# Patient Record
Sex: Female | Born: 1950 | Race: White | Hispanic: No | Smoking: Former smoker
Health system: Southern US, Community
[De-identification: ages and names within clinical notes are randomized; demographics above are authoritative.]

## PROBLEM LIST (undated history)

## (undated) DIAGNOSIS — R112 Nausea with vomiting, unspecified: Secondary | ICD-10-CM

## (undated) DIAGNOSIS — Z9889 Other specified postprocedural states: Secondary | ICD-10-CM

## (undated) DIAGNOSIS — I10 Essential (primary) hypertension: Secondary | ICD-10-CM

## (undated) DIAGNOSIS — E039 Hypothyroidism, unspecified: Secondary | ICD-10-CM

## (undated) HISTORY — PX: THYROID CYST EXCISION: SHX2511

## (undated) HISTORY — PX: SHOULDER SURGERY: SHX246

## (undated) HISTORY — PX: BREAST BIOPSY: SHX20

## (undated) HISTORY — PX: HEMORROIDECTOMY: SUR656

---

## 1997-08-19 ENCOUNTER — Other Ambulatory Visit: Admission: RE | Admit: 1997-08-19 | Discharge: 1997-08-19 | Payer: Self-pay | Admitting: Obstetrics and Gynecology

## 1998-07-05 ENCOUNTER — Other Ambulatory Visit: Admission: RE | Admit: 1998-07-05 | Discharge: 1998-07-05 | Payer: Self-pay | Admitting: Obstetrics and Gynecology

## 1999-06-28 ENCOUNTER — Other Ambulatory Visit: Admission: RE | Admit: 1999-06-28 | Discharge: 1999-06-28 | Payer: Self-pay | Admitting: Obstetrics and Gynecology

## 1999-08-23 ENCOUNTER — Other Ambulatory Visit: Admission: RE | Admit: 1999-08-23 | Discharge: 1999-08-23 | Payer: Self-pay | Admitting: Obstetrics and Gynecology

## 1999-08-23 ENCOUNTER — Encounter (INDEPENDENT_AMBULATORY_CARE_PROVIDER_SITE_OTHER): Payer: Self-pay

## 1999-09-27 ENCOUNTER — Ambulatory Visit (HOSPITAL_BASED_OUTPATIENT_CLINIC_OR_DEPARTMENT_OTHER): Admission: RE | Admit: 1999-09-27 | Discharge: 1999-09-27 | Payer: Self-pay | Admitting: Orthopedic Surgery

## 2000-07-12 ENCOUNTER — Other Ambulatory Visit: Admission: RE | Admit: 2000-07-12 | Discharge: 2000-07-12 | Payer: Self-pay | Admitting: *Deleted

## 2001-05-22 ENCOUNTER — Emergency Department (HOSPITAL_COMMUNITY): Admission: EM | Admit: 2001-05-22 | Discharge: 2001-05-22 | Payer: Self-pay | Admitting: Emergency Medicine

## 2001-05-22 ENCOUNTER — Encounter: Payer: Self-pay | Admitting: Emergency Medicine

## 2001-07-19 ENCOUNTER — Other Ambulatory Visit: Admission: RE | Admit: 2001-07-19 | Discharge: 2001-07-19 | Payer: Self-pay | Admitting: Obstetrics & Gynecology

## 2002-09-19 ENCOUNTER — Other Ambulatory Visit: Admission: RE | Admit: 2002-09-19 | Discharge: 2002-09-19 | Payer: Self-pay | Admitting: Obstetrics & Gynecology

## 2009-05-31 ENCOUNTER — Encounter: Admission: RE | Admit: 2009-05-31 | Discharge: 2009-05-31 | Payer: Self-pay | Admitting: Neurosurgery

## 2010-07-11 ENCOUNTER — Encounter: Payer: Self-pay | Admitting: Orthopedic Surgery

## 2010-07-13 ENCOUNTER — Emergency Department (HOSPITAL_COMMUNITY)
Admission: EM | Admit: 2010-07-13 | Discharge: 2010-07-13 | Disposition: A | Discharge status: Other Acute Inpatient Hospital | Payer: Self-pay | Source: Home / Self Care | Admitting: Emergency Medicine

## 2010-07-13 ENCOUNTER — Observation Stay (HOSPITAL_COMMUNITY)
Admission: EM | Admit: 2010-07-13 | Discharge: 2010-07-14 | Payer: Self-pay | Source: Home / Self Care | Admitting: Emergency Medicine

## 2010-07-13 LAB — POCT I-STAT, CHEM 8
BUN: 15 mg/dL (ref 6–23)
Calcium, Ion: 1.02 mmol/L — ABNORMAL LOW (ref 1.12–1.32)
Chloride: 107 mEq/L (ref 96–112)
Creatinine, Ser: 0.7 mg/dL (ref 0.4–1.2)
Glucose, Bld: 102 mg/dL — ABNORMAL HIGH (ref 70–99)
HCT: 35 % — ABNORMAL LOW (ref 36.0–46.0)
Hemoglobin: 11.9 g/dL — ABNORMAL LOW (ref 12.0–15.0)
Potassium: 4.4 mEq/L (ref 3.5–5.1)
Sodium: 138 mEq/L (ref 135–145)
TCO2: 24 mmol/L (ref 0–100)

## 2010-07-13 LAB — POCT CARDIAC MARKERS
CKMB, poc: <1 ng/mL — ABNORMAL LOW (ref 1.0–8.0)
Myoglobin, poc: 31.8 ng/mL (ref 12–200)
Troponin i, poc: <0.05 ng/mL (ref 0.00–0.09)

## 2010-07-13 LAB — URINALYSIS, ROUTINE W REFLEX MICROSCOPIC
Bilirubin Urine: NEGATIVE
Hgb urine dipstick: NEGATIVE
Ketones, ur: 15 mg/dL — AB
Nitrite: NEGATIVE
Protein, ur: NEGATIVE mg/dL
Specific Gravity, Urine: 1.013 (ref 1.005–1.030)
Urine Glucose, Fasting: NEGATIVE mg/dL
Urobilinogen, UA: 0.2 mg/dL (ref 0.0–1.0)
pH: 6.5 (ref 5.0–8.0)

## 2010-07-13 LAB — DIFFERENTIAL
Basophils Absolute: 0 10*3/uL (ref 0.0–0.1)
Basophils Relative: 0 % (ref 0–1)
Eosinophils Absolute: 0 10*3/uL (ref 0.0–0.7)
Eosinophils Relative: 1 % (ref 0–5)
Lymphocytes Relative: 38 % (ref 12–46)
Lymphs Abs: 1.5 10*3/uL (ref 0.7–4.0)
Monocytes Absolute: 0.4 10*3/uL (ref 0.1–1.0)
Monocytes Relative: 11 % (ref 3–12)
Neutro Abs: 2 10*3/uL (ref 1.7–7.7)
Neutrophils Relative %: 51 % (ref 43–77)

## 2010-07-13 LAB — URINE MICROSCOPIC-ADD ON

## 2010-07-13 LAB — CBC
HCT: 32.8 % — ABNORMAL LOW (ref 36.0–46.0)
Hemoglobin: 10.9 g/dL — ABNORMAL LOW (ref 12.0–15.0)
MCH: 33.6 pg (ref 26.0–34.0)
MCHC: 33.2 g/dL (ref 30.0–36.0)
MCV: 101.2 fL — ABNORMAL HIGH (ref 78.0–100.0)
Platelets: 245 10*3/uL (ref 150–400)
RBC: 3.24 MIL/uL — ABNORMAL LOW (ref 3.87–5.11)
RDW: 12 % (ref 11.5–15.5)
WBC: 3.9 10*3/uL — ABNORMAL LOW (ref 4.0–10.5)

## 2010-07-13 LAB — PROTIME-INR
INR: 0.92 (ref 0.00–1.49)
Prothrombin Time: 12.6 seconds (ref 11.6–15.2)

## 2010-07-14 LAB — URINE CULTURE
Colony Count: 40000
Culture  Setup Time: 201201252107

## 2010-07-14 LAB — POCT CARDIAC MARKERS
CKMB, poc: <1 ng/mL — ABNORMAL LOW (ref 1.0–8.0)
CKMB, poc: <1 ng/mL — ABNORMAL LOW (ref 1.0–8.0)
Myoglobin, poc: 30.9 ng/mL (ref 12–200)
Myoglobin, poc: 35.7 ng/mL (ref 12–200)
Troponin i, poc: <0.05 ng/mL (ref 0.00–0.09)
Troponin i, poc: <0.05 ng/mL (ref 0.00–0.09)

## 2010-10-05 ENCOUNTER — Other Ambulatory Visit: Payer: Self-pay | Admitting: Neurosurgery

## 2010-10-05 DIAGNOSIS — M545 Low back pain, unspecified: Secondary | ICD-10-CM

## 2010-10-06 ENCOUNTER — Emergency Department (HOSPITAL_COMMUNITY)
Admission: EM | Admit: 2010-10-06 | Discharge: 2010-10-06 | Disposition: A | Discharge status: Home / Self Care | Payer: BC Managed Care – PPO | Attending: Emergency Medicine | Admitting: Emergency Medicine

## 2010-10-06 ENCOUNTER — Inpatient Hospital Stay: Admission: RE | Admit: 2010-10-06 | Payer: Self-pay | Source: Ambulatory Visit

## 2010-10-06 DIAGNOSIS — E039 Hypothyroidism, unspecified: Secondary | ICD-10-CM | POA: Insufficient documentation

## 2010-10-06 DIAGNOSIS — M545 Low back pain, unspecified: Secondary | ICD-10-CM | POA: Insufficient documentation

## 2010-10-06 DIAGNOSIS — I10 Essential (primary) hypertension: Secondary | ICD-10-CM | POA: Insufficient documentation

## 2010-10-06 DIAGNOSIS — M79609 Pain in unspecified limb: Secondary | ICD-10-CM | POA: Insufficient documentation

## 2010-10-08 ENCOUNTER — Ambulatory Visit
Admission: RE | Admit: 2010-10-08 | Discharge: 2010-10-08 | Disposition: A | Discharge status: Home / Self Care | Payer: BC Managed Care – PPO | Source: Ambulatory Visit | Attending: Neurosurgery | Admitting: Neurosurgery

## 2010-10-08 DIAGNOSIS — M545 Low back pain, unspecified: Secondary | ICD-10-CM

## 2010-11-04 NOTE — Op Note (Signed)
[facility]  Patient:    Megan Stokes, Megan Stokes                    MRN: [id number] Proc. Date: 09/27/99 Adm. Date:  [id number] Attending:  Susa Day CC:         Lunette Stands, M.D.             Katy Fitch Sypher, Montez Hageman., M.D. x 2                           Operative Report  PREOPERATIVE DIAGNOSIS:  Entrapment neuropathy of median nerve, right carpal tunnel.  POSTOPERATIVE DIAGNOSIS:  Entrapment neuropathy of median nerve, right carpal tunnel.  OPERATION: 1. Release of right transverse carpal ligament. 2. Arthrodesis of right index finger distal interphalangeal joint with 0.025 inch    Kirschner wire fixation.  SURGEON:  Katy Fitch. Naaman Plummer., M.D.  ASSISTANT:  Rhoderick Moody, P.A.  ANESTHESIA:  General by mask.  SUPERVISING ANESTHESIOLOGIST:  Maren Beach, M.D.  INDICATIONS:  The patient is a 60 year old woman who has been troubled by numbness in her right hand and pain in her right index finger DIP joint.  She had had a previous mucous cyst treated with cauterization of the cyst, and had a residual  nail deformity of the radial aspect of the right index fingernail.  There is a consequence of injury to the dorsal and intermediate nail fold.  She is brought to the operating room at this time for release of the right transverse carpal ligament and arthrodesis of her right index finger DIP joint ith Kirschner wire fixation and debridement of residuals of her mucous cyst and dorsal osteophytes.  DESCRIPTION OF PROCEDURE:  The patient was brought to the operating room and placed in the supine position on the operating table.  Following induction of general anesthesia, the right arm was prepped with Betadine soap and solution, and sterilely draped.  Following exsanguination of the limb with an Esmarch bandage, the arterial tourniquet of the proximal brachium was inflated to 220 mmHg.  The  procedure commenced with a short  incision in the line of the ring finger in the  palm.  Subcutaneous tissues were carefully divided ________ of palmar fascia. This was split longitudinally to reveal the common extensor branches of the median nerve.  These were followed back to the transverse carpal ligament where it was  carefully isolated to the median nerve.  The ligament was released on its ulnar border, extending into the distal forearm.  Bleeding points along the margin of the released transverse carpal ligament was  electrocauterized with bipolar current.  The wound was then repaired with intradermal 3-0 Prolene suture.  Attention was then directed to the right index finger DIP joint.  A dorsal curvilinear incision was fashioned directly over the DIP.  Subcutaneous tissue as carefully divided, taking care to spare the dorsal cutaneous nerves.  Several vessels were electrocauterized.  The extensor tendon was transected 2 mm proximal to its insertion at the distal phalanx.  The DIP joint was opened and the collateral ligaments released.  A _______ arthrodesis was performed with rongeurs, shaping the end of the middle phalanx to its dome with removal of the condyles nd creating a cup on the distal phalangeal side with the aid of a high speed bur.  The joint was positioned in 5 degrees of flexion, slight supination, and neutral deviation.  This was  carried to two 0.025 inch Kirschner wires placed with retrograde technique.  AP and lateral C-arm images were obtained, documenting satisfactory position of the joint and Kirschner wires.  The extensor tendon was repaired with mattress sutures of 4-0 Vicryl followed by repair of the skin with interrupted sutures of 5-0 nylon.  A compressive dressing applied with a _______ splint protecting the finger, and a volar plaster splint, maintaining the wrist in 5 degrees of dorsiflexion. DD:  09/27/99 TD:  09/28/99 Job: [id number]

## 2014-01-23 ENCOUNTER — Other Ambulatory Visit: Payer: Self-pay | Admitting: Neurosurgery

## 2014-02-05 ENCOUNTER — Encounter (HOSPITAL_COMMUNITY): Payer: Self-pay

## 2014-02-05 ENCOUNTER — Encounter (HOSPITAL_COMMUNITY)
Admission: RE | Admit: 2014-02-05 | Discharge: 2014-02-05 | Disposition: A | Discharge status: Home / Self Care | Payer: BC Managed Care – PPO | Source: Ambulatory Visit | Attending: Neurosurgery | Admitting: Neurosurgery

## 2014-02-05 ENCOUNTER — Encounter (HOSPITAL_COMMUNITY): Payer: Self-pay | Admitting: Pharmacy Technician

## 2014-02-05 DIAGNOSIS — M48061 Spinal stenosis, lumbar region without neurogenic claudication: Secondary | ICD-10-CM | POA: Insufficient documentation

## 2014-02-05 DIAGNOSIS — M4120 Other idiopathic scoliosis, site unspecified: Secondary | ICD-10-CM | POA: Insufficient documentation

## 2014-02-05 DIAGNOSIS — Z01818 Encounter for other preprocedural examination: Secondary | ICD-10-CM | POA: Diagnosis present

## 2014-02-05 DIAGNOSIS — M545 Low back pain, unspecified: Secondary | ICD-10-CM | POA: Insufficient documentation

## 2014-02-05 DIAGNOSIS — M51379 Other intervertebral disc degeneration, lumbosacral region without mention of lumbar back pain or lower extremity pain: Secondary | ICD-10-CM | POA: Insufficient documentation

## 2014-02-05 DIAGNOSIS — M5137 Other intervertebral disc degeneration, lumbosacral region: Secondary | ICD-10-CM | POA: Diagnosis not present

## 2014-02-05 DIAGNOSIS — Q762 Congenital spondylolisthesis: Secondary | ICD-10-CM | POA: Insufficient documentation

## 2014-02-05 DIAGNOSIS — IMO0002 Reserved for concepts with insufficient information to code with codable children: Secondary | ICD-10-CM | POA: Insufficient documentation

## 2014-02-05 HISTORY — DX: Other specified postprocedural states: R11.2

## 2014-02-05 HISTORY — DX: Hypothyroidism, unspecified: E03.9

## 2014-02-05 HISTORY — DX: Other specified postprocedural states: Z98.890

## 2014-02-05 LAB — BASIC METABOLIC PANEL
Anion gap: 11 (ref 5–15)
BUN: 14 mg/dL (ref 6–23)
CO2: 29 mEq/L (ref 19–32)
Calcium: 9.5 mg/dL (ref 8.4–10.5)
Chloride: 101 mEq/L (ref 96–112)
Creatinine, Ser: 0.68 mg/dL (ref 0.50–1.10)
GFR calc Af Amer: >90 mL/min (ref >90)
GFR calc non Af Amer: 90 mL/min — ABNORMAL LOW (ref >90)
Glucose, Bld: 98 mg/dL (ref 70–99)
Potassium: 4.3 mEq/L (ref 3.7–5.3)
Sodium: 141 mEq/L (ref 137–147)

## 2014-02-05 LAB — TYPE AND SCREEN
ABO/RH(D): A POS
Antibody Screen: NEGATIVE

## 2014-02-05 LAB — CBC
HCT: 35.3 % — ABNORMAL LOW (ref 36.0–46.0)
Hemoglobin: 11.9 g/dL — ABNORMAL LOW (ref 12.0–15.0)
MCH: 34.9 pg — ABNORMAL HIGH (ref 26.0–34.0)
MCHC: 33.7 g/dL (ref 30.0–36.0)
MCV: 103.5 fL — ABNORMAL HIGH (ref 78.0–100.0)
Platelets: 275 10*3/uL (ref 150–400)
RBC: 3.41 MIL/uL — ABNORMAL LOW (ref 3.87–5.11)
RDW: 12.4 % (ref 11.5–15.5)
WBC: 4.2 10*3/uL (ref 4.0–10.5)

## 2014-02-05 LAB — SURGICAL PCR SCREEN
MRSA, PCR: NEGATIVE
Staphylococcus aureus: NEGATIVE

## 2014-02-05 LAB — ABO/RH: ABO/RH(D): A POS

## 2014-02-05 NOTE — Progress Notes (Signed)
Patient denied having any cardiac or pulmonary issues. During PAT visit patients blood pressure was found to be elevated and it was 194/90 at heart rate was 62. Patient stated "my blood pressure normally runs in the 130s-140s over 70s, but I'm having pain so I guess that's why it's up." Nurse asked patient when she last saw her PCP Dr. Welton Flakes and she informed Nurse that it had been a few years.  Blood pressure was rechecked before patient left PAT and it was 178/80. Nurse called Ebony Hail, Barry and informed her of this. Ebony Hail stated that patient needed to be seen by her PCP for elevated blood pressure. Nurse informed patient of this, wrote both blood pressures down, and instructed patient to make a appointment with her PCP to have her blood pressure evaluated. Patient verbalized understanding.

## 2014-02-05 NOTE — Pre-Procedure Instructions (Signed)
BREYON BLASS  02/05/2014   Your procedure is scheduled on:  Thursday February 12, 2014 at 7:30 AM.  Report to [facility] Admitting at 5:30 AM.  Call this number if you have problems the morning of surgery: [phone]   Remember:   Do not eat food or drink liquids after midnight.   Take these medicines the morning of surgery with A SIP OF WATER: Levothyroxine (Synthroid)   Discontinue aspirin and herbal medications 7 days prior to surgery   Do not wear jewelry, make-up or nail polish.  Do not wear lotions, powders, or perfumes.   Do not shave 48 hours prior to surgery.  Do not bring valuables to the hospital.  [facility] is not responsible for any belongings or valuables.               Contacts, dentures or bridgework may not be worn into surgery.  Leave suitcase in the car. After surgery it may be brought to your room.  For patients admitted to the hospital, discharge time is determined by your treatment team.               Patients discharged the day of surgery will not be allowed to drive home.  Name and phone number of your driver: Family/Friend  Special Instructions: Shower using CHG soap the night before and the morning of your surgery   Please read over the following fact sheets that you were given: Pain Booklet, Coughing and Deep Breathing, Blood Transfusion Information and MRSA Information

## 2014-02-11 MED ORDER — CEFAZOLIN SODIUM-DEXTROSE 2-3 GM-% IV SOLR
2.0000 g | INTRAVENOUS | Status: AC
  Administered 2014-02-12: 2 g via INTRAVENOUS
  Filled 2014-02-11: qty 50

## 2014-02-12 ENCOUNTER — Inpatient Hospital Stay (HOSPITAL_COMMUNITY): Payer: BC Managed Care – PPO | Admitting: Certified Registered"

## 2014-02-12 ENCOUNTER — Encounter (HOSPITAL_COMMUNITY): Payer: Self-pay | Admitting: *Deleted

## 2014-02-12 ENCOUNTER — Inpatient Hospital Stay (HOSPITAL_COMMUNITY): Payer: BC Managed Care – PPO

## 2014-02-12 ENCOUNTER — Encounter (HOSPITAL_COMMUNITY)
Admission: RE | Disposition: A | Discharge status: Home / Self Care | Payer: BC Managed Care – PPO | Source: Ambulatory Visit | Attending: Neurosurgery

## 2014-02-12 ENCOUNTER — Encounter (HOSPITAL_COMMUNITY): Payer: BC Managed Care – PPO | Admitting: Vascular Surgery

## 2014-02-12 ENCOUNTER — Inpatient Hospital Stay (HOSPITAL_COMMUNITY)
Admission: RE | Admit: 2014-02-12 | Discharge: 2014-02-14 | DRG: 460 | Disposition: A | Discharge status: Home / Self Care | Payer: BC Managed Care – PPO | Source: Ambulatory Visit | Attending: Neurosurgery | Admitting: Neurosurgery

## 2014-02-12 DIAGNOSIS — Z87891 Personal history of nicotine dependence: Secondary | ICD-10-CM

## 2014-02-12 DIAGNOSIS — M4120 Other idiopathic scoliosis, site unspecified: Secondary | ICD-10-CM | POA: Diagnosis present

## 2014-02-12 DIAGNOSIS — Z885 Allergy status to narcotic agent status: Secondary | ICD-10-CM | POA: Diagnosis not present

## 2014-02-12 DIAGNOSIS — E039 Hypothyroidism, unspecified: Secondary | ICD-10-CM | POA: Diagnosis present

## 2014-02-12 DIAGNOSIS — M48062 Spinal stenosis, lumbar region with neurogenic claudication: Secondary | ICD-10-CM | POA: Diagnosis present

## 2014-02-12 DIAGNOSIS — Q762 Congenital spondylolisthesis: Secondary | ICD-10-CM

## 2014-02-12 DIAGNOSIS — M4316 Spondylolisthesis, lumbar region: Secondary | ICD-10-CM | POA: Diagnosis present

## 2014-02-12 DIAGNOSIS — M549 Dorsalgia, unspecified: Secondary | ICD-10-CM | POA: Diagnosis present

## 2014-02-12 SURGERY — POSTERIOR LUMBAR FUSION 1 LEVEL
Anesthesia: General | Site: Spine Lumbar

## 2014-02-12 MED ORDER — MIDAZOLAM HCL 2 MG/2ML IJ SOLN
INTRAMUSCULAR | Status: AC
  Filled 2014-02-12: qty 2

## 2014-02-12 MED ORDER — FENTANYL CITRATE 0.05 MG/ML IJ SOLN
INTRAMUSCULAR | Status: AC
  Filled 2014-02-12: qty 5

## 2014-02-12 MED ORDER — ONDANSETRON HCL 4 MG/2ML IJ SOLN: 4.0000 mg | INTRAMUSCULAR | Status: DC | PRN

## 2014-02-12 MED ORDER — SODIUM CHLORIDE 0.9 % IR SOLN
Status: DC | PRN
  Administered 2014-02-12: 09:00:00

## 2014-02-12 MED ORDER — CEFAZOLIN SODIUM-DEXTROSE 2-3 GM-% IV SOLR
2.0000 g | Freq: Three times a day (TID) | INTRAVENOUS | Status: AC
  Administered 2014-02-12 – 2014-02-13 (×2): 2 g via INTRAVENOUS
  Filled 2014-02-12 (×2): qty 50

## 2014-02-12 MED ORDER — 0.9 % SODIUM CHLORIDE (POUR BTL) OPTIME
TOPICAL | Status: DC | PRN
  Administered 2014-02-12: 1000 mL

## 2014-02-12 MED ORDER — ACETAMINOPHEN 325 MG PO TABS
650.0000 mg | ORAL_TABLET | ORAL | Status: DC | PRN
Start: 2014-02-12 — End: 2014-02-14

## 2014-02-12 MED ORDER — DEXAMETHASONE SODIUM PHOSPHATE 10 MG/ML IJ SOLN
INTRAMUSCULAR | Status: DC | PRN
  Administered 2014-02-12: 8 mg via INTRAVENOUS

## 2014-02-12 MED ORDER — HYDROCODONE-ACETAMINOPHEN 5-325 MG PO TABS: 1.0000 | ORAL_TABLET | ORAL | Status: DC | PRN

## 2014-02-12 MED ORDER — ONDANSETRON HCL 4 MG/2ML IJ SOLN
INTRAMUSCULAR | Status: AC
  Filled 2014-02-12: qty 4

## 2014-02-12 MED ORDER — NEOSTIGMINE METHYLSULFATE 10 MG/10ML IV SOLN
INTRAVENOUS | Status: DC | PRN
  Administered 2014-02-12: 3 mg via INTRAVENOUS

## 2014-02-12 MED ORDER — KETOROLAC TROMETHAMINE 30 MG/ML IJ SOLN
INTRAMUSCULAR | Status: AC
  Filled 2014-02-12: qty 1

## 2014-02-12 MED ORDER — FENTANYL CITRATE 0.05 MG/ML IJ SOLN
INTRAMUSCULAR | Status: AC
  Filled 2014-02-12: qty 2

## 2014-02-12 MED ORDER — PHENOL 1.4 % MT LIQD: 1.0000 | OROMUCOSAL | Status: DC | PRN

## 2014-02-12 MED ORDER — SCOPOLAMINE 1 MG/3DAYS TD PT72
MEDICATED_PATCH | TRANSDERMAL | Status: DC | PRN
  Administered 2014-02-12: 1 via TRANSDERMAL

## 2014-02-12 MED ORDER — PROPOFOL 10 MG/ML IV BOLUS
INTRAVENOUS | Status: DC | PRN
  Administered 2014-02-12: 150 mg via INTRAVENOUS

## 2014-02-12 MED ORDER — DIAZEPAM 5 MG PO TABS
5.0000 mg | ORAL_TABLET | Freq: Four times a day (QID) | ORAL | Status: DC | PRN
  Administered 2014-02-12: 5 mg via ORAL

## 2014-02-12 MED ORDER — ROCURONIUM BROMIDE 50 MG/5ML IV SOLN
INTRAVENOUS | Status: AC
  Filled 2014-02-12: qty 1

## 2014-02-12 MED ORDER — LACTATED RINGERS IV SOLN
INTRAVENOUS | Status: DC
  Administered 2014-02-12 – 2014-02-13 (×2): via INTRAVENOUS

## 2014-02-12 MED ORDER — BUPIVACAINE-EPINEPHRINE (PF) 0.5% -1:200000 IJ SOLN
INTRAMUSCULAR | Status: DC | PRN
  Administered 2014-02-12: 10 mL

## 2014-02-12 MED ORDER — MENTHOL 3 MG MT LOZG: 1.0000 | LOZENGE | OROMUCOSAL | Status: DC | PRN

## 2014-02-12 MED ORDER — SUCCINYLCHOLINE CHLORIDE 20 MG/ML IJ SOLN
INTRAMUSCULAR | Status: AC
  Filled 2014-02-12: qty 1

## 2014-02-12 MED ORDER — SCOPOLAMINE 1 MG/3DAYS TD PT72
MEDICATED_PATCH | TRANSDERMAL | Status: AC
  Filled 2014-02-12: qty 1

## 2014-02-12 MED ORDER — DOCUSATE SODIUM 100 MG PO CAPS
100.0000 mg | ORAL_CAPSULE | Freq: Two times a day (BID) | ORAL | Status: DC
  Administered 2014-02-12 – 2014-02-14 (×5): 100 mg via ORAL
  Filled 2014-02-12 (×5): qty 1

## 2014-02-12 MED ORDER — THROMBIN 20000 UNITS EX SOLR
CUTANEOUS | Status: DC | PRN
  Administered 2014-02-12: 09:00:00 via TOPICAL

## 2014-02-12 MED ORDER — PROPOFOL 10 MG/ML IV BOLUS
INTRAVENOUS | Status: AC
  Filled 2014-02-12: qty 20

## 2014-02-12 MED ORDER — LACTATED RINGERS IV SOLN
INTRAVENOUS | Status: DC | PRN
  Administered 2014-02-12 (×2): via INTRAVENOUS

## 2014-02-12 MED ORDER — LEVOTHYROXINE SODIUM 50 MCG PO TABS
75.0000 ug | ORAL_TABLET | Freq: Every day | ORAL | Status: DC
  Administered 2014-02-13: 75 ug via ORAL
  Filled 2014-02-12 (×2): qty 1

## 2014-02-12 MED ORDER — BACITRACIN ZINC 500 UNIT/GM EX OINT
TOPICAL_OINTMENT | CUTANEOUS | Status: DC | PRN
  Administered 2014-02-12: 1 via TOPICAL

## 2014-02-12 MED ORDER — DIAZEPAM 5 MG PO TABS
ORAL_TABLET | ORAL | Status: AC
  Filled 2014-02-12: qty 1

## 2014-02-12 MED ORDER — PHENYLEPHRINE 40 MCG/ML (10ML) SYRINGE FOR IV PUSH (FOR BLOOD PRESSURE SUPPORT)
PREFILLED_SYRINGE | INTRAVENOUS | Status: AC
  Filled 2014-02-12: qty 10

## 2014-02-12 MED ORDER — ONDANSETRON HCL 4 MG/2ML IJ SOLN
INTRAMUSCULAR | Status: DC | PRN
  Administered 2014-02-12: 4 mg via INTRAVENOUS

## 2014-02-12 MED ORDER — OXYCODONE-ACETAMINOPHEN 5-325 MG PO TABS
1.0000 | ORAL_TABLET | ORAL | Status: DC | PRN
  Administered 2014-02-12 – 2014-02-14 (×5): 2 via ORAL
  Filled 2014-02-12 (×5): qty 2

## 2014-02-12 MED ORDER — MIDAZOLAM HCL 5 MG/5ML IJ SOLN
INTRAMUSCULAR | Status: DC | PRN
  Administered 2014-02-12: 2 mg via INTRAVENOUS

## 2014-02-12 MED ORDER — BUPIVACAINE LIPOSOME 1.3 % IJ SUSP
20.0000 mL | INTRAMUSCULAR | Status: AC
  Filled 2014-02-12: qty 20

## 2014-02-12 MED ORDER — ROCURONIUM BROMIDE 100 MG/10ML IV SOLN
INTRAVENOUS | Status: DC | PRN
  Administered 2014-02-12: 50 mg via INTRAVENOUS
  Administered 2014-02-12: 30 mg via INTRAVENOUS

## 2014-02-12 MED ORDER — BUPIVACAINE LIPOSOME 1.3 % IJ SUSP
INTRAMUSCULAR | Status: DC | PRN
  Administered 2014-02-12: 20 mL

## 2014-02-12 MED ORDER — GLYCOPYRROLATE 0.2 MG/ML IJ SOLN
INTRAMUSCULAR | Status: DC | PRN
  Administered 2014-02-12: 0.2 mg via INTRAVENOUS
  Administered 2014-02-12: 0.4 mg via INTRAVENOUS

## 2014-02-12 MED ORDER — ALUM & MAG HYDROXIDE-SIMETH 200-200-20 MG/5ML PO SUSP: 30.0000 mL | Freq: Four times a day (QID) | ORAL | Status: DC | PRN

## 2014-02-12 MED ORDER — OXYCODONE HCL 5 MG PO TABS
5.0000 mg | ORAL_TABLET | Freq: Once | ORAL | Status: AC
  Administered 2014-02-12: 5 mg via ORAL

## 2014-02-12 MED ORDER — FENTANYL CITRATE 0.05 MG/ML IJ SOLN
25.0000 ug | INTRAMUSCULAR | Status: DC | PRN
  Administered 2014-02-12: 50 ug via INTRAVENOUS
  Administered 2014-02-12 (×3): 25 ug via INTRAVENOUS

## 2014-02-12 MED ORDER — ACETAMINOPHEN 650 MG RE SUPP: 650.0000 mg | RECTAL | Status: DC | PRN

## 2014-02-12 MED ORDER — LIDOCAINE HCL (CARDIAC) 20 MG/ML IV SOLN
INTRAVENOUS | Status: DC | PRN
  Administered 2014-02-12: 40 mg via INTRAVENOUS

## 2014-02-12 MED ORDER — FENTANYL CITRATE 0.05 MG/ML IJ SOLN
INTRAMUSCULAR | Status: DC | PRN
  Administered 2014-02-12 (×2): 50 ug via INTRAVENOUS
  Administered 2014-02-12: 100 ug via INTRAVENOUS
  Administered 2014-02-12: 50 ug via INTRAVENOUS

## 2014-02-12 MED ORDER — MORPHINE SULFATE 2 MG/ML IJ SOLN
1.0000 mg | INTRAMUSCULAR | Status: DC | PRN
  Administered 2014-02-12 – 2014-02-13 (×2): 2 mg via INTRAVENOUS
  Filled 2014-02-12 (×2): qty 1

## 2014-02-12 MED ORDER — OXYCODONE HCL 5 MG PO TABS
ORAL_TABLET | ORAL | Status: AC
  Filled 2014-02-12: qty 1

## 2014-02-12 MED ORDER — KETOROLAC TROMETHAMINE 30 MG/ML IJ SOLN
INTRAMUSCULAR | Status: DC | PRN
  Administered 2014-02-12: 30 mg via INTRAVENOUS

## 2014-02-12 MED ORDER — PHENYLEPHRINE HCL 10 MG/ML IJ SOLN
INTRAMUSCULAR | Status: DC | PRN
  Administered 2014-02-12 (×3): 80 ug via INTRAVENOUS

## 2014-02-12 SURGICAL SUPPLY — 68 items
BAG DECANTER FOR FLEXI CONT (MISCELLANEOUS) ×2 IMPLANT
BENZOIN TINCTURE PRP APPL 2/3 (GAUZE/BANDAGES/DRESSINGS) ×2 IMPLANT
BLADE SURG ROTATE 9660 (MISCELLANEOUS) IMPLANT
BRUSH SCRUB EZ PLAIN DRY (MISCELLANEOUS) ×2 IMPLANT
BUR ACORN 6.0 (BURR) ×2 IMPLANT
BUR MATCHSTICK NEURO 3.0 LAGG (BURR) ×2 IMPLANT
CANISTER SUCT 3000ML (MISCELLANEOUS) ×2 IMPLANT
CAP REVERE LOCKING (Cap) ×10 IMPLANT
CONT SPEC 4OZ CLIKSEAL STRL BL (MISCELLANEOUS) ×2 IMPLANT
COVER BACK TABLE 24X17X13 BIG (DRAPES) IMPLANT
COVER TABLE BACK 60X90 (DRAPES) ×2 IMPLANT
DRAPE C-ARM 42X72 X-RAY (DRAPES) ×4 IMPLANT
DRAPE LAPAROTOMY 100X72X124 (DRAPES) ×2 IMPLANT
DRAPE POUCH INSTRU U-SHP 10X18 (DRAPES) ×2 IMPLANT
DRAPE PROXIMA HALF (DRAPES) ×2 IMPLANT
DRAPE SURG 17X23 STRL (DRAPES) ×8 IMPLANT
ELECT BLADE 4.0 EZ CLEAN MEGAD (MISCELLANEOUS) ×2
ELECT REM PT RETURN 9FT ADLT (ELECTROSURGICAL) ×2
ELECTRODE BLDE 4.0 EZ CLN MEGD (MISCELLANEOUS) ×1 IMPLANT
ELECTRODE REM PT RTRN 9FT ADLT (ELECTROSURGICAL) ×1 IMPLANT
EVACUATOR 1/8 PVC DRAIN (DRAIN) ×2 IMPLANT
GAUZE SPONGE 4X4 12PLY STRL (GAUZE/BANDAGES/DRESSINGS) ×2 IMPLANT
GAUZE SPONGE 4X4 16PLY XRAY LF (GAUZE/BANDAGES/DRESSINGS) ×2 IMPLANT
GLOVE BIO SURGEON STRL SZ7 (GLOVE) ×4 IMPLANT
GLOVE BIO SURGEON STRL SZ8.5 (GLOVE) ×4 IMPLANT
GLOVE BIOGEL PI IND STRL 7.5 (GLOVE) ×2 IMPLANT
GLOVE BIOGEL PI INDICATOR 7.5 (GLOVE) ×2
GLOVE EXAM NITRILE LRG STRL (GLOVE) IMPLANT
GLOVE EXAM NITRILE MD LF STRL (GLOVE) IMPLANT
GLOVE EXAM NITRILE XL STR (GLOVE) IMPLANT
GLOVE EXAM NITRILE XS STR PU (GLOVE) IMPLANT
GLOVE SS BIOGEL STRL SZ 8 (GLOVE) ×2 IMPLANT
GLOVE SUPERSENSE BIOGEL SZ 8 (GLOVE) ×2
GLOVE SURG SS PI 7.0 STRL IVOR (GLOVE) ×4 IMPLANT
GOWN STRL REUS W/ TWL LRG LVL3 (GOWN DISPOSABLE) ×1 IMPLANT
GOWN STRL REUS W/ TWL XL LVL3 (GOWN DISPOSABLE) ×3 IMPLANT
GOWN STRL REUS W/TWL 2XL LVL3 (GOWN DISPOSABLE) IMPLANT
GOWN STRL REUS W/TWL LRG LVL3 (GOWN DISPOSABLE) ×1
GOWN STRL REUS W/TWL XL LVL3 (GOWN DISPOSABLE) ×3
KIT BASIN OR (CUSTOM PROCEDURE TRAY) ×2 IMPLANT
KIT ROOM TURNOVER OR (KITS) ×2 IMPLANT
NEEDLE HYPO 21X1.5 SAFETY (NEEDLE) ×2 IMPLANT
NEEDLE HYPO 22GX1.5 SAFETY (NEEDLE) ×2 IMPLANT
NS IRRIG 1000ML POUR BTL (IV SOLUTION) ×2 IMPLANT
PACK FOAM VITOSS 10CC (Orthopedic Implant) ×2 IMPLANT
PACK LAMINECTOMY NEURO (CUSTOM PROCEDURE TRAY) ×2 IMPLANT
PAD ARMBOARD 7.5X6 YLW CONV (MISCELLANEOUS) ×6 IMPLANT
PATTIES SURGICAL .5 X1 (DISPOSABLE) IMPLANT
PUTTY KINEX BIACTIVE P 10CC (Putty) ×2 IMPLANT
RASP 3.0MM (RASP) ×2 IMPLANT
ROD REVERE 6.35 45MM (Rod) ×6 IMPLANT
SCREW 7.5X45MM (Screw) ×6 IMPLANT
SCREW REVERE 6.35 7.5X40 (Screw) ×4 IMPLANT
SPACER SUSTAIN O SM 8X22 10M (Spacer) ×4 IMPLANT
SPONGE LAP 4X18 X RAY DECT (DISPOSABLE) IMPLANT
SPONGE NEURO XRAY DETECT 1X3 (DISPOSABLE) IMPLANT
SPONGE SURGIFOAM ABS GEL 100 (HEMOSTASIS) ×2 IMPLANT
STRIP CLOSURE SKIN 1/2X4 (GAUZE/BANDAGES/DRESSINGS) ×2 IMPLANT
SUT VIC AB 1 CT1 18XBRD ANBCTR (SUTURE) ×2 IMPLANT
SUT VIC AB 1 CT1 8-18 (SUTURE) ×2
SUT VIC AB 2-0 CP2 18 (SUTURE) ×4 IMPLANT
SYR 20CC LL (SYRINGE) ×2 IMPLANT
SYR 20ML ECCENTRIC (SYRINGE) ×2 IMPLANT
TAPE CLOTH SURG 4X10 WHT LF (GAUZE/BANDAGES/DRESSINGS) ×2 IMPLANT
TOWEL OR 17X24 6PK STRL BLUE (TOWEL DISPOSABLE) ×2 IMPLANT
TOWEL OR 17X26 10 PK STRL BLUE (TOWEL DISPOSABLE) ×2 IMPLANT
TRAY FOLEY CATH 14FRSI W/METER (CATHETERS) ×2 IMPLANT
WATER STERILE IRR 1000ML POUR (IV SOLUTION) ×2 IMPLANT

## 2014-02-12 NOTE — Transfer of Care (Signed)
Immediate Anesthesia Transfer of Care Note  Patient: Megan Stokes  Procedure(s) Performed: Procedure(s): LUMBAR FOUR-FIVE LAMINECTOMY WITH POSTERIOR LUMBAR INTERBODY FUSION WITH INTERBODY PROSTHESIS POSTERIOR LATERAL ARTHRODESIS AND POSTERIOR NON SEGMENTAL INSTRUMENTATION (N/A)  Patient Location: PACU  Anesthesia Type:General  Level of Consciousness: awake, alert  and oriented  Airway & Oxygen Therapy: Patient connected to face mask oxygen  Post-op Assessment: Report given to PACU RN  Post vital signs: stable  Complications: No apparent anesthesia complications

## 2014-02-12 NOTE — Op Note (Signed)
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 She has failed medical management and was worked up with a lumbar MRI and lumbar x-rays. These demonstrated she had an L4-5 spondylolisthesis with spinal stenosis. I discussed the various treatment options with the patient. She has weighed the risks, benefits, and alternatives surgery and decided proceed with an L4-5 decompression, instrumentation, and fusion.  Preoperative diagnosis: Lumbar scoliosis, L4-5 spondylolisthesis, Degenerative disc disease, spinal stenosis compressing both the L4 and the L5 nerve roots; lumbago; lumbar radiculopathy  Postoperative diagnosis: The same  Procedure: Bilateral L4 Laminotomy/foraminotomies to decompress the bilateral L4 and L5 nerve roots(the work required to do this was in addition to the work required to do the posterior lumbar interbody fusion because of the patient's spinal stenosis, facet arthropathy. Etc. requiring a wide decompression of the nerve roots.); L4-5 posterior lumbar interbody fusion with local morselized autograft bone and globus Kinnex graft extender; insertion of interbody prosthesis at L4-5 (globus peek interbody prosthesis); posterior nonsegmental instrumentation from L4 to L5 with globus titanium pedicle screws and rods; posterior lateral arthrodesis at L4-5 with local morselized autograft bone and Vitoss bone graft extender.  Surgeon: Dr. Earle Stokes  Asst.: Dr. Erline Levine  Anesthesia: Gen. endotracheal  Estimated blood loss: 200 cc  Drains: One medium Hemovac  Complications: None  Description of procedure: The patient was brought to the operating room by the anesthesia team. General endotracheal anesthesia was induced. The patient was turned to the prone position on the Wilson frame. The patient's lumbosacral region was then prepared with Betadine scrub and Betadine solution. Sterile  drapes were applied.  I then injected the area to be incised with Marcaine with epinephrine solution. I then used the scalpel to make a linear midline incision over the L4-5 interspace. I then used electrocautery to perform a bilateral subperiosteal dissection exposing the spinous process and lamina of L4 and L5. We then obtained intraoperative radiograph to confirm our location. We then inserted the Verstrac retractor to provide exposure.  I began the decompression by using the high speed drill to perform laminotomies at L4 bilaterally. We then used the Kerrison punches to widen the laminotomy and removed the ligamentum flavum at L4-5. We used the Kerrison punches to remove the medial facets at L4-5. We performed wide foraminotomies about the bilateral L4 and L5 nerve roots completing the decompression.  We now turned our attention to the posterior lumbar interbody fusion. I used a scalpel to incise the intervertebral disc at L4-5. I then performed a partial intervertebral discectomy at L4-5 using the pituitary forceps. We prepared the vertebral endplates at V5-6 for the fusion by removing the soft tissues with the curettes. We then used the trial spacers to pick the appropriate sized interbody prosthesis. We prefilled his prosthesis with a combination of local morselized autograft bone that we obtained during the decompression as well as Actifuse bone graft extender. We inserted the prefilled prosthesis into the interspace at L4-5. There was a good snug fit of the prosthesis in the interspace. We then filled and the remainder of the intervertebral disc space with local morselized autograft bone and Actifuse. This completed the posterior lumbar interbody arthrodesis.  We now turned attention to the instrumentation. Under fluoroscopic guidance we cannulated the bilateral L4-5 pedicles with the bone probe. We then removed the bone probe. We then tapped the pedicle with a 0.5 millimeter tap. We then removed  the tap. We probed  inside the tapped pedicle with a ball probe to rule out cortical breaches. We then inserted a 7.5 x 40 and 45 millimeter pedicle screw into the L4 and L5 pedicles bilaterally under fluoroscopic guidance. We then palpated along the medial aspect of the pedicles to rule out cortical breaches. There were none. The nerve roots were not injured. We then connected the unilateral pedicle screws with a lordotic rod. We tightened the cap  at the left L4 pedicle screw . The cap did not fully engaged. We attempted to remove the cap but we could not loosen the inner bolt on the locking cap. At this point the cap and rod was stuck on the screw at L4 on the left.  The only thing we could do was cut the rod and remove the left L4 pedicle screw with the rod engaged in the cap. We then replaced the left L4 pedicle screw with a new screw. We then connected the unilateral screws with a lordotic rod. We compressed the construct and secured the rod in place with the caps. We then tightened the caps appropriately. This completed the instrumentation from L4-5.  We now turned our attention to the posterior lateral arthrodesis at L4-5. We used the high-speed drill to decorticate the remainder of the facets, pars, transverse process at L4-5. We then applied a combination of local morselized autograft bone and Vitoss bone graft extender over these decorticated posterior lateral structures. This completed the posterior lateral arthrodesis.  We then obtained hemostasis using bipolar electrocautery. We irrigated the wound out with bacitracin solution. We inspected the thecal sac and nerve roots and noted they were well decompressed. We then removed the retractor. We placed a medium Hemovac drain in the epidural space and tunneled out through separate stab wound. We reapproximated patient's thoracolumbar fascia with interrupted #1 Vicryl suture. We reapproximated patient's subcutaneous tissue with interrupted 2-0 Vicryl  suture. The reapproximated patient's skin with Steri-Strips and benzoin. The wound was then coated with bacitracin ointment. A sterile dressing was applied. The drapes were removed. The patient was subsequently returned to the supine position where they were extubated by the anesthesia team. He was then transported to the post anesthesia care unit in stable condition. All sponge instrument and needle counts were reportedly correct at the end of this case.

## 2014-02-12 NOTE — Anesthesia Postprocedure Evaluation (Signed)
  Anesthesia Post-op Note  Patient: Megan Stokes  Procedure(s) Performed: Procedure(s): LUMBAR FOUR-FIVE LAMINECTOMY WITH POSTERIOR LUMBAR INTERBODY FUSION WITH INTERBODY PROSTHESIS POSTERIOR LATERAL ARTHRODESIS AND POSTERIOR NON SEGMENTAL INSTRUMENTATION (N/A)  Patient Location: PACU  Anesthesia Type:General  Level of Consciousness: awake  Airway and Oxygen Therapy: Patient Spontanous Breathing  Post-op Pain: mild  Post-op Assessment: Post-op Vital signs reviewed  Post-op Vital Signs: Reviewed  Last Vitals:  Filed Vitals:   02/12/14 1228  BP:   Pulse: 62  Temp:   Resp: 13    Complications: No apparent anesthesia complications

## 2014-02-12 NOTE — Progress Notes (Signed)
Patient ID: Megan Stokes, female   DOB: 08-Aug-1950, 63 y.o.   MRN: [id number] Subjective:  The patient is somnolent but easily arousable. She is in no apparent distress.  Objective: Vital signs in last 24 hours: Temp:  [97 F (36.1 C)] 97 F (36.1 C) (08/27 0621) Pulse Rate:  [59] 59 (08/27 0621) Resp:  [18] 18 (08/27 0621) BP: (195)/(97) 195/97 mmHg (08/27 0621) SpO2:  [100 %] 100 % (08/27 0621) Weight:  [51.71 kg (114 lb)] 51.71 kg (114 lb) (08/27 0621)  Intake/Output from previous day:   Intake/Output this shift: Total I/O In: 1000 [I.V.:1000] Out: 48 [Urine:190; Blood:300]  Physical exam patient is somnolent but arousable. Her strength is normal in her bile gastrocnemius and dorsiflexors.  Lab Results: No results found for this basename: WBC, HGB, HCT, PLT,  in the last 72 hours BMET No results found for this basename: NA, K, CL, CO2, GLUCOSE, BUN, CREATININE, CALCIUM,  in the last 72 hours  Studies/Results: Dg Lumbar Spine 2-3 Views  02/12/2014   CLINICAL DATA:  Posterior lumbar fusion.  EXAM: DG C-ARM 61-120 MIN; LUMBAR SPINE - 2-3 VIEW  COMPARISON:  MRI scan of Nov 15, 2013.  FINDINGS: Two intraoperative fluoroscopic images of the lower lumbar spine were submitted for review. These demonstrate bilateral intrapedicular screw placement present at L4 and L5. Interbody fusion is noted. Good alignment of vertebral bodies is noted.  IMPRESSION: Status post posterior fusion of L4-5.   Electronically Signed   By: Sabino Dick M.D.   On: 02/12/2014 11:00   Dg Lumbar Spine 1 View  02/12/2014   CLINICAL DATA:  L4-5 fusion.  EXAM: LUMBAR SPINE - 1 VIEW  COMPARISON:  11/04/2013  FINDINGS: Exam demonstrates evidence patient's known a grade 1 anterolisthesis of L4 on L5. There is mild spondylosis present. Disc space narrowing is present at the L4-5 and L5-S1 levels. A surgical instrument is present with tip over the posterior elements at the L4 level.  IMPRESSION: Mild spondylosis of the  lumbar spine with disc disease at the L4-5 and L5-S1 levels. Grade 1 anterolisthesis of L4 on L5. Surgical instrument over the posterior elements of L4. Recommend correlation with findings at the time of the procedure.   Electronically Signed   By: Marin Olp M.D.   On: 02/12/2014 11:00   Dg C-arm 1-60 Min  02/12/2014   CLINICAL DATA:  Posterior lumbar fusion.  EXAM: DG C-ARM 61-120 MIN; LUMBAR SPINE - 2-3 VIEW  COMPARISON:  MRI scan of Nov 15, 2013.  FINDINGS: Two intraoperative fluoroscopic images of the lower lumbar spine were submitted for review. These demonstrate bilateral intrapedicular screw placement present at L4 and L5. Interbody fusion is noted. Good alignment of vertebral bodies is noted.  IMPRESSION: Status post posterior fusion of L4-5.   Electronically Signed   By: Sabino Dick M.D.   On: 02/12/2014 11:00    Assessment/Plan: The patient is doing well.  LOS: 0 days     Dimitrius Steedman D 02/12/2014, 11:24 AM

## 2014-02-12 NOTE — Anesthesia Procedure Notes (Signed)
Procedure Name: Intubation Date/Time: 02/12/2014 7:35 AM Performed by: Maeola Harman Pre-anesthesia Checklist: Patient identified, Emergency Drugs available, Suction available, Patient being monitored and Timeout performed Patient Re-evaluated:Patient Re-evaluated prior to inductionOxygen Delivery Method: Circle system utilized Preoxygenation: Pre-oxygenation with 100% oxygen Intubation Type: IV induction Ventilation: Mask ventilation without difficulty Laryngoscope Size: Mac and 3 Grade View: Grade II Tube type: Oral Tube size: 7.5 mm Number of attempts: 1 Airway Equipment and Method: Stylet Placement Confirmation: ETT inserted through vocal cords under direct vision,  positive ETCO2 and breath sounds checked- equal and bilateral Secured at: 23 cm Tube secured with: Tape Dental Injury: Teeth and Oropharynx as per pre-operative assessment  Comments: Easy atraumatic induction and intubation with MAC 3 blade.  Dr. Oletta Lamas verified placement of ETT.  Waldron Session, CRNA

## 2014-02-12 NOTE — Progress Notes (Signed)
Care of pt assumed by MA Maycol Hoying RN 

## 2014-02-12 NOTE — Anesthesia Preprocedure Evaluation (Addendum)
Anesthesia Evaluation  Patient identified by MRN, date of birth, ID band Patient awake    Reviewed: Allergy & Precautions, H&P , NPO status , Patient's Chart, lab work & pertinent test results, reviewed documented beta blocker date and time   History of Anesthesia Complications (+) PONV  Airway Mallampati: II TM Distance: >3 FB     Dental  (+) Teeth Intact, Dental Advisory Given   Pulmonary neg pulmonary ROS, former smoker,  breath sounds clear to auscultation        Cardiovascular hypertension, negative cardio ROS  Rhythm:Regular     Neuro/Psych negative neurological ROS  negative psych ROS   GI/Hepatic negative GI ROS, Neg liver ROS,   Endo/Other  Hypothyroidism   Renal/GU negative Renal ROS  negative genitourinary   Musculoskeletal negative musculoskeletal ROS (+)   Abdominal (+)  Abdomen: soft. Bowel sounds: normal.  Peds negative pediatric ROS (+)  Hematology   Anesthesia Other Findings   Reproductive/Obstetrics negative OB ROS                          Anesthesia Physical Anesthesia Plan  ASA: III  Anesthesia Plan: General   Post-op Pain Management:    Induction: Intravenous  Airway Management Planned: Oral ETT  Additional Equipment:   Intra-op Plan:   Post-operative Plan: Extubation in OR  Informed Consent: I have reviewed the patients History and Physical, chart, labs and discussed the procedure including the risks, benefits and alternatives for the proposed anesthesia with the patient or authorized representative who has indicated his/her understanding and acceptance.   Dental advisory given  Plan Discussed with: CRNA and Anesthesiologist  Anesthesia Plan Comments:         Anesthesia Quick Evaluation

## 2014-02-12 NOTE — H&P (Signed)
Subjective: The patient is a 63 year old white female who has complained of back, buttock, and leg pain consistent with neurogenic claudication. She has failed medical management and was worked up with a lumbar MRI and lumbar x-rays. This demonstrated an L4-5 spondylolisthesis with spinal stenosis. I discussed the various treatment options including surgery. She has weighed the risks, benefits, and alternatives surgery and decided proceed with at L4-5 decompression, instrumentation, and fusion.   Past Medical History  Diagnosis Date  . PONV (postoperative nausea and vomiting)   . Hypothyroidism     Past Surgical History  Procedure Laterality Date  . Shoulder surgery Left     Pt had limited movement with left shoulder/arm  . Cesarean section      X 2  . Hemorroidectomy    . Thyroid cyst excision    . Breast biopsy Left     Allergies  Allergen Reactions  . Codeine Nausea And Vomiting    History  Substance Use Topics  . Smoking status: Former Research scientist (life sciences)  . Smokeless tobacco: Not on file  . Alcohol Use: 1.2 - 1.8 oz/week    2-3 Glasses of wine per week     Comment: nightly    History reviewed. No pertinent family history. Prior to Admission medications   Medication Sig Start Date End Date Taking? Authorizing Provider  ibuprofen (ADVIL,MOTRIN) 200 MG tablet Take 200 mg by mouth every 4 (four) hours as needed (pain).     Historical Provider, MD  levothyroxine (SYNTHROID, LEVOTHROID) 75 MCG tablet Take 75 mcg by mouth daily before breakfast.   Yes Historical Provider, MD     Review of Systems  Positive ROS: As above  All other systems have been reviewed and were otherwise negative with the exception of those mentioned in the HPI and as above.  Objective: Vital signs in last 24 hours: Temp:  [97 F (36.1 C)] 97 F (36.1 C) (08/27 1740) Pulse Rate:  [59] 59 (08/27 0621) Resp:  [18] 18 (08/27 0621) BP: (195)/(97) 195/97 mmHg (08/27 0621) SpO2:  [100 %] 100 % (08/27  0621) Weight:  [51.71 kg (114 lb)] 51.71 kg (114 lb) (08/27 0621)  General Appearance: Alert, cooperative, no distress, Head: Normocephalic, without obvious abnormality, atraumatic Eyes: PERRL, conjunctiva/corneas clear, EOM's intact,    Ears: Normal  Throat: Normal  Neck: Supple, symmetrical, trachea midline, no adenopathy; thyroid: No enlargement/tenderness/nodules; no carotid bruit or JVD Back: Symmetric, no curvature, ROM normal, no CVA tenderness Lungs: Clear to auscultation bilaterally, respirations unlabored Heart: Regular rate and rhythm, no murmur, rub or gallop Abdomen: Soft, non-tender,, no masses, no organomegaly Extremities: Extremities normal, atraumatic, no cyanosis or edema Pulses: 2+ and symmetric all extremities Skin: Skin color, texture, turgor normal, no rashes or lesions  NEUROLOGIC:   Mental status: alert and oriented, no aphasia, good attention span, Fund of knowledge/ memory ok Motor Exam - grossly normal Sensory Exam - grossly normal Reflexes:  Coordination - grossly normal Gait - grossly normal Balance - grossly normal Cranial Nerves: I: smell Not tested  II: visual acuity  OS: Normal  OD: Normal   II: visual fields Full to confrontation  II: pupils Equal, round, reactive to light  III,VII: ptosis None  III,IV,VI: extraocular muscles  Full ROM  V: mastication Normal  V: facial light touch sensation  Normal  V,VII: corneal reflex  Present  VII: facial muscle function - upper  Normal  VII: facial muscle function - lower Normal  VIII: hearing Not tested  IX: soft palate elevation  Normal  IX,X: gag reflex Present  XI: trapezius strength  5/5  XI: sternocleidomastoid strength 5/5  XI: neck flexion strength  5/5  XII: tongue strength  Normal    Data Review Lab Results  Component Value Date   WBC 4.2 02/05/2014   HGB 11.9* 02/05/2014   HCT 35.3* 02/05/2014   MCV 103.5* 02/05/2014   PLT 275 02/05/2014   Lab Results  Component Value Date   NA 141  02/05/2014   K 4.3 02/05/2014   CL 101 02/05/2014   CO2 29 02/05/2014   BUN 14 02/05/2014   CREATININE 0.68 02/05/2014   GLUCOSE 98 02/05/2014   Lab Results  Component Value Date   INR 0.92 07/13/2010    Assessment/Plan: L4-5 spondylolisthesis, spinal stenosis, lumbago, lumbar radiculopathy, neurogenic claudication: I discussed situation with the patient. I have reviewed her imaging studies with her and pointed out the abnormalities. We have discussed the various treatment options including surgery. I described the surgical treatment option L4-5 decompression, instrumentation, and fusion. I've shown her surgical models. We have discussed the risks, benefits, alternatives, and likelihood of achieving our goals with surgery. I have answered all the patient's questions. She has decided to proceed with surgery.   Kennedie Pardoe D 02/12/2014 7:10 AM

## 2014-02-12 NOTE — Progress Notes (Signed)
Utilization review completed.  

## 2014-02-13 LAB — CBC
HCT: 27.3 % — ABNORMAL LOW (ref 36.0–46.0)
Hemoglobin: 9.2 g/dL — ABNORMAL LOW (ref 12.0–15.0)
MCH: 36.2 pg — ABNORMAL HIGH (ref 26.0–34.0)
MCHC: 33.7 g/dL (ref 30.0–36.0)
MCV: 107.5 fL — ABNORMAL HIGH (ref 78.0–100.0)
Platelets: 219 10*3/uL (ref 150–400)
RBC: 2.54 MIL/uL — ABNORMAL LOW (ref 3.87–5.11)
RDW: 12.5 % (ref 11.5–15.5)
WBC: 6.1 10*3/uL (ref 4.0–10.5)

## 2014-02-13 LAB — BASIC METABOLIC PANEL
Anion gap: 9 (ref 5–15)
BUN: 11 mg/dL (ref 6–23)
CO2: 27 mEq/L (ref 19–32)
Calcium: 8.1 mg/dL — ABNORMAL LOW (ref 8.4–10.5)
Chloride: 99 mEq/L (ref 96–112)
Creatinine, Ser: 0.71 mg/dL (ref 0.50–1.10)
GFR calc Af Amer: >90 mL/min (ref >90)
GFR calc non Af Amer: 89 mL/min — ABNORMAL LOW (ref >90)
Glucose, Bld: 99 mg/dL (ref 70–99)
Potassium: 4.3 mEq/L (ref 3.7–5.3)
Sodium: 135 mEq/L — ABNORMAL LOW (ref 137–147)

## 2014-02-13 MED FILL — Heparin Sodium (Porcine) Inj 1000 Unit/ML: INTRAMUSCULAR | Qty: 30 | Status: AC

## 2014-02-13 MED FILL — Sodium Chloride IV Soln 0.9%: INTRAVENOUS | Qty: 1000 | Status: AC

## 2014-02-13 NOTE — Progress Notes (Signed)
Patient ID: Megan Stokes, female   DOB: [birth year], 63 y.o.   MRN: [id number] Subjective:  The patient is alert and pleasant. She looks well.  Objective: Vital signs in last 24 hours: Temp:  [98.1 F (36.7 C)-98.6 F (37 C)] 98.3 F (36.8 C) (08/28 0600) Pulse Rate:  [52-70] 62 (08/28 0600) Resp:  [11-19] 18 (08/28 0600) BP: (98-143)/(43-69) 103/48 mmHg (08/28 0600) SpO2:  [98 %-100 %] 100 % (08/28 0600) Weight:  [54.432 kg (120 lb)] 54.432 kg (120 lb) (08/27 2028)  Intake/Output from previous day: 08/27 0701 - 08/28 0700 In: 1360 [P.O.:60; I.V.:1300] Out: 1500 [Urine:1040; Drains:160; Blood:300] Intake/Output this shift:    Physical exam patient is alert and oriented. Her strength is grossly normal lower extremities.  Lab Results:  Recent Labs  02/13/14 0711  WBC 6.1  HGB 9.2*  HCT 27.3*  PLT 219   BMET No results found for this basename: NA, K, CL, CO2, GLUCOSE, BUN, CREATININE, CALCIUM,  in the last 72 hours  Studies/Results: Dg Lumbar Spine 2-3 Views  02/12/2014   CLINICAL DATA:  Posterior lumbar fusion.  EXAM: DG C-ARM 61-120 MIN; LUMBAR SPINE - 2-3 VIEW  COMPARISON:  MRI scan of Nov 15, 2013.  FINDINGS: Two intraoperative fluoroscopic images of the lower lumbar spine were submitted for review. These demonstrate bilateral intrapedicular screw placement present at L4 and L5. Interbody fusion is noted. Good alignment of vertebral bodies is noted.  IMPRESSION: Status post posterior fusion of L4-5.   Electronically Signed   By: Sabino Dick M.D.   On: 02/12/2014 11:00   Dg Lumbar Spine 1 View  02/12/2014   CLINICAL DATA:  L4-5 fusion.  EXAM: LUMBAR SPINE - 1 VIEW  COMPARISON:  11/04/2013  FINDINGS: Exam demonstrates evidence patient's known a grade 1 anterolisthesis of L4 on L5. There is mild spondylosis present. Disc space narrowing is present at the L4-5 and L5-S1 levels. A surgical instrument is present with tip over the posterior elements at the L4 level.   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 Grade 1 anterolisthesis of L4 on L5. Surgical instrument over the posterior elements of L4. Recommend correlation with findings at the time of the procedure.   Electronically Signed   By: Marin Olp M.D.   On: 02/12/2014 11:00   Dg C-arm 1-60 Min  02/12/2014   CLINICAL DATA:  Posterior lumbar fusion.  EXAM: DG C-ARM 61-120 MIN; LUMBAR SPINE - 2-3 VIEW  COMPARISON:  MRI scan of Nov 15, 2013.  FINDINGS: Two intraoperative fluoroscopic images of the lower lumbar spine were submitted for review. These demonstrate bilateral intrapedicular screw placement present at L4 and L5. Interbody fusion is noted. Good alignment of vertebral bodies is noted.  IMPRESSION: Status post posterior fusion of L4-5.   Electronically Signed   By: Sabino Dick M.D.   On: 02/12/2014 11:00    Assessment/Plan: Postop day 1: The patient is doing well. I will discontinue her Hemovac drain. She will likely go home tomorrow. I gave her her discharge instructions and answered all her questions.  LOS: 1 day     Kendrell Lottman D 02/13/2014, 8:03 AM

## 2014-02-13 NOTE — Evaluation (Signed)
Physical Therapy Evaluation Patient Details Name: Megan Stokes MRN: [id number] DOB: 07/11/1950 Today's Date: 02/13/2014   History of Present Illness  pt s/p L45 lami/foraminectomies to decompress l4 and L5 nerve roots and PLIF to L45.  Clinical Impression  Pt admitted with/for lumbar fusion surgery.  Pt currently limited functionally due to the problems listed below.  (see problems list.)  Pt will benefit from PT to maximize function and safety to be able to get home safely with available assist of family.     Follow Up Recommendations No PT follow up    Equipment Recommendations  None recommended by PT    Recommendations for Other Services       Precautions / Restrictions Precautions Precautions: Back Required Braces or Orthoses: Spinal Brace Spinal Brace: Lumbar corset;Applied in sitting position Restrictions Weight Bearing Restrictions: No      Mobility  Bed Mobility Overal bed mobility: Needs Assistance Bed Mobility: Sidelying to Sit;Sit to Sidelying;Rolling Rolling: Supervision Sidelying to sit: Supervision     Sit to sidelying: Supervision General bed mobility comments: instructed in log roll and safe technique from side to/from sit.    Transfers Overall transfer level: Needs assistance Equipment used: Rolling walker (2 wheeled) Transfers: Sit to/from Stand Sit to Stand: Modified independent (Device/Increase time)         General transfer comment: reinforced safe transfer technqiue  Ambulation/Gait Ambulation/Gait assistance: Modified independent (Device/Increase time) Ambulation Distance (Feet): 600 Feet Assistive device: None Gait Pattern/deviations: WFL(Within Functional Limits)   Gait velocity interpretation: at or above normal speed for age/gender General Gait Details: safe and steady  Stairs Stairs: Yes Stairs assistance: Modified independent (Device/Increase time) Stair Management: One rail Right;Alternating pattern;Forwards Number of  Stairs: 5 General stair comments: safe and steady  Wheelchair Mobility    Modified Rankin (Stroke Patients Only)       Balance Overall balance assessment: No apparent balance deficits (not formally assessed)                                           Pertinent Vitals/Pain Pain Assessment:  (incisional pain only, not rated)    Home Living Family/patient expects to be discharged to:: Private residence Living Arrangements: Children (son lives with her, he works days) Available Help at Discharge: Family;Friend(s) Type of Home: House Home Access: Stairs to enter Entrance Stairs-Rails: Right Entrance Stairs-Number of Steps: 2 Home Layout: One level Home Equipment: None      Prior Function Level of Independence: Independent               Hand Dominance        Extremity/Trunk Assessment   Upper Extremity Assessment: Defer to OT evaluation           Lower Extremity Assessment: Overall WFL for tasks assessed      Cervical / Trunk Assessment: Normal  Communication   Communication: No difficulties  Cognition Arousal/Alertness: Awake/alert Behavior During Therapy: WFL for tasks assessed/performed Overall Cognitive Status: Within Functional Limits for tasks assessed                      General Comments General comments (skin integrity, edema, etc.): educated in back care/precautions, log roll, bracing issues, lifting restrictions, progression of activity.    Exercises        Assessment/Plan    PT Assessment Patient needs continued PT services  PT Diagnosis  Acute pain   PT Problem List Decreased mobility;Decreased knowledge of precautions;Pain  PT Treatment Interventions Functional mobility training;Gait training;Patient/family education   PT Goals (Current goals can be found in the Care Plan section) Acute Rehab PT Goals Patient Stated Goal: independence and back to work PT Goal Formulation: With patient Time For Goal  Achievement: 02/15/14 Potential to Achieve Goals: Good    Frequency Min 1X/week   Barriers to discharge        Co-evaluation               End of Session Equipment Utilized During Treatment: Back brace Activity Tolerance: Patient tolerated treatment well Patient left: in bed;with call bell/phone within reach;with family/visitor present Nurse Communication: Mobility status         Time: 1005-1025 PT Time Calculation (min): 20 min   Charges:   PT Evaluation $Initial PT Evaluation Tier I: 1 Procedure PT Treatments $Gait Training: 8-22 mins   PT G Codes:          Markeya Mincy, Tessie Fass 02/13/2014, 10:48 AM 02/13/2014  Donnella Sham, PT [phone]  (pager)

## 2014-02-14 MED ORDER — OXYCODONE-ACETAMINOPHEN 5-325 MG PO TABS: 1.0000 | ORAL_TABLET | ORAL | Status: DC | PRN

## 2014-02-14 NOTE — Discharge Summary (Signed)
Physician Discharge Summary  Patient ID: Megan Stokes MRN: [id number] DOB/AGE: October 16, 1950 63 y.o.  Admit date: 02/12/2014 Discharge date: 02/14/2014  Admission Diagnoses: Spondylolisthesis    Discharge Diagnoses: Same   Discharged Condition: good  Hospital Course: The patient was admitted on 02/12/2014 and taken to the operating room where the patient underwent PLIF. The patient tolerated the procedure well and was taken to the recovery room and then to the floor in stable condition. The hospital course was routine. There were no complications. The wound remained clean dry and intact. Pt had appropriate back soreness. No complaints of leg pain or new N/T/W. The patient remained afebrile with stable vital signs, and tolerated a regular diet. The patient continued to increase activities, and pain was well controlled with oral pain medications.   Consults: None  Significant Diagnostic Studies:  Results for orders placed during the hospital encounter of 02/12/14  CBC      Result Value Ref Range   WBC 6.1  4.0 - 10.5 K/uL   RBC 2.54 (*) 3.87 - 5.11 MIL/uL   Hemoglobin 9.2 (*) 12.0 - 15.0 g/dL   HCT 27.3 (*) 36.0 - 46.0 %   MCV 107.5 (*) 78.0 - 100.0 fL   MCH 36.2 (*) 26.0 - 34.0 pg   MCHC 33.7  30.0 - 36.0 g/dL   RDW 12.5  11.5 - 15.5 %   Platelets 219  150 - 400 K/uL  BASIC METABOLIC PANEL      Result Value Ref Range   Sodium 135 (*) 137 - 147 mEq/L   Potassium 4.3  3.7 - 5.3 mEq/L   Chloride 99  96 - 112 mEq/L   CO2 27  19 - 32 mEq/L   Glucose, Bld 99  70 - 99 mg/dL   BUN 11  6 - 23 mg/dL   Creatinine, Ser 0.71  0.50 - 1.10 mg/dL   Calcium 8.1 (*) 8.4 - 10.5 mg/dL   GFR calc non Af Amer 89 (*) >90 mL/min   GFR calc Af Amer >90  >90 mL/min   Anion gap 9  5 - 15    Dg Lumbar Spine 2-3 Views  02/12/2014   CLINICAL DATA:  Posterior lumbar fusion.  EXAM: DG C-ARM 61-120 MIN; LUMBAR SPINE - 2-3 VIEW  COMPARISON:  MRI scan of Nov 15, 2013.  FINDINGS: Two intraoperative  fluoroscopic images of the lower lumbar spine were submitted for review. These demonstrate bilateral intrapedicular screw placement present at L4 and L5. Interbody fusion is noted. Good alignment of vertebral bodies is noted.  IMPRESSION: Status post posterior fusion of L4-5.   Electronically Signed   By: Sabino Dick M.D.   On: 02/12/2014 11:00   Dg Lumbar Spine 1 View  02/12/2014   CLINICAL DATA:  L4-5 fusion.  EXAM: LUMBAR SPINE - 1 VIEW  COMPARISON:  11/04/2013  FINDINGS: Exam demonstrates evidence patient's known a grade 1 anterolisthesis of L4 on L5. There is mild spondylosis present. Disc space narrowing is present at the L4-5 and L5-S1 levels. A surgical instrument is present with tip over the posterior elements at the L4 level.  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 Grade 1 anterolisthesis of L4 on L5. Surgical instrument over the posterior elements of L4. Recommend correlation with findings at the time of the procedure.   Electronically Signed   By: Marin Olp M.D.   On: 02/12/2014 11:00   Dg C-arm 1-60 Min  02/12/2014   CLINICAL DATA:  Posterior lumbar fusion.  EXAM: DG C-ARM 61-120 MIN; LUMBAR SPINE - 2-3 VIEW  COMPARISON:  MRI scan of Nov 15, 2013.  FINDINGS: Two intraoperative fluoroscopic images of the lower lumbar spine were submitted for review. These demonstrate bilateral intrapedicular screw placement present at L4 and L5. Interbody fusion is noted. Good alignment of vertebral bodies is noted.  IMPRESSION: Status post posterior fusion of L4-5.   Electronically Signed   By: Sabino Dick M.D.   On: 02/12/2014 11:00    Antibiotics:  Anti-infectives   Start     Dose/Rate Route Frequency Ordered Stop   02/12/14 1600  ceFAZolin (ANCEF) IVPB 2 g/50 mL premix     2 g 100 mL/hr over 30 Minutes Intravenous Every 8 hours 02/12/14 1405 02/13/14 0112   02/12/14 0839  bacitracin 50,000 Units in sodium chloride irrigation 0.9 % 500 mL  irrigation  Status:  Discontinued       As needed 02/12/14 0840 02/12/14 1115   02/12/14 0600  ceFAZolin (ANCEF) IVPB 2 g/50 mL premix     2 g 100 mL/hr over 30 Minutes Intravenous On call to O.R. 02/11/14 1404 02/12/14 0805      Discharge Exam: Blood pressure 118/60, pulse 77, temperature 99.3 F (37.4 C), temperature source Oral, resp. rate 18, height 5\' 2"  (1.575 m), weight 54.432 kg (120 lb), SpO2 94.00%. Neurologic: Grossly normal Incision clean dry and intact  Discharge Medications:     Medication List    STOP taking these medications       ibuprofen 200 MG tablet  Commonly known as:  ADVIL,MOTRIN      TAKE these medications       levothyroxine 75 MCG tablet  Commonly known as:  SYNTHROID, LEVOTHROID  Take 75 mcg by mouth daily before breakfast.     oxyCODONE-acetaminophen 5-325 MG per tablet  Commonly known as:  PERCOCET/ROXICET  Take 1-2 tablets by mouth every 4 (four) hours as needed for moderate pain.        Disposition: Home   Final Dx: PLIF      Discharge Instructions   Call MD for:  difficulty breathing, headache or visual disturbances    Complete by:  As directed      Call MD for:  persistant nausea and vomiting    Complete by:  As directed      Call MD for:  redness, tenderness, or signs of infection (pain, swelling, redness, odor or green/yellow discharge around incision site)    Complete by:  As directed      Call MD for:  severe uncontrolled pain    Complete by:  As directed      Call MD for:  temperature >100.4    Complete by:  As directed      Diet - low sodium heart healthy    Complete by:  As directed      Discharge instructions    Complete by:  As directed   No strenuous activity, no driving, no bending, and no heavy lifting, may shower normally     Increase activity slowly    Complete by:  As directed      Remove dressing in 48 hours    Complete by:  As directed               Signed: JONES,DAVID S 02/14/2014, 8:35  AM

## 2014-02-14 NOTE — Progress Notes (Signed)
Physical Therapy Treatment Patient Details Name: Megan Stokes MRN: [id number] DOB: 07/12/1950 Today's Date: 02-20-14    History of Present Illness pt s/p L45 lami/foraminectomies to decompress l4 and L5 nerve roots and PLIF to L45.    PT Comments    Pt safe at current level for discharge home with family assist as needed.  Follow Up Recommendations  No PT follow up     Equipment Recommendations  None recommended by PT       Precautions / Restrictions Precautions Precautions: Back Precaution Comments: pt able to recall all back precautions without cues/assist Required Braces or Orthoses: Spinal Brace Spinal Brace: Applied in sitting position (cues on how to don brace with out assistance) Restrictions Weight Bearing Restrictions: No    Mobility  Bed Mobility       Sidelying to sit: Modified independent (Device/Increase time)       General bed mobility comments: with bed flat and no rail used.  Transfers Overall transfer level: Modified independent Equipment used: None Transfers: Sit to/from Stand Sit to Stand: Modified independent (Device/Increase time)         General transfer comment: demo'd safe technique  Ambulation/Gait Ambulation/Gait assistance: Modified independent (Device/Increase time) Ambulation Distance (Feet): 300 Feet Assistive device: None Gait Pattern/deviations: WFL(Within Functional Limits) Gait velocity: WNL Gait velocity interpretation: at or above normal speed for age/gender General Gait Details: Steady with no issues noted   Stairs            Wheelchair Mobility    Modified Rankin (Stroke Patients Only)          Cognition Arousal/Alertness: Awake/alert Behavior During Therapy: WFL for tasks assessed/performed Overall Cognitive Status: Within Functional Limits for tasks assessed                       Pertinent Vitals/Pain Pain Assessment: No/denies pain     PT Goals (current goals can now be found  in the care plan section) Acute Rehab PT Goals Patient Stated Goal: independence and back to work PT Goal Formulation: With patient Time For Goal Achievement: 02/15/14 Potential to Achieve Goals: Good Progress towards PT goals: Progressing toward goals    Frequency  Min 1X/week    PT Plan Current plan remains appropriate       End of Session Equipment Utilized During Treatment: Back brace Activity Tolerance: Patient tolerated treatment well Patient left: in chair;with call bell/phone within reach     Time: 0930-0942 PT Time Calculation (min): 12 min  Charges:  $Gait Training: 8-22 mins                    G Codes:      Willow Ora 2014-02-20, 9:45 AM  Willow Ora, PTA Office- [phone]

## 2014-02-14 NOTE — Progress Notes (Signed)
Pt discharged home with son.  Pt discharge instructions received with verbal understanding.

## 2015-12-08 LAB — HM COLONOSCOPY

## 2016-04-11 ENCOUNTER — Other Ambulatory Visit: Payer: Self-pay | Admitting: Student

## 2016-04-11 ENCOUNTER — Ambulatory Visit
Admission: RE | Admit: 2016-04-11 | Discharge: 2016-04-11 | Disposition: A | Discharge status: Home / Self Care | Payer: Medicare Other | Source: Ambulatory Visit | Attending: Family Medicine | Admitting: Family Medicine

## 2016-04-11 DIAGNOSIS — M25511 Pain in right shoulder: Secondary | ICD-10-CM

## 2016-12-11 ENCOUNTER — Other Ambulatory Visit: Payer: Self-pay | Admitting: Obstetrics and Gynecology

## 2017-01-02 ENCOUNTER — Ambulatory Visit (HOSPITAL_COMMUNITY): Payer: Medicare Other

## 2017-06-08 DIAGNOSIS — E039 Hypothyroidism, unspecified: Secondary | ICD-10-CM | POA: Insufficient documentation

## 2018-01-23 ENCOUNTER — Other Ambulatory Visit: Payer: Self-pay | Admitting: Nurse Practitioner

## 2018-01-23 DIAGNOSIS — Z1231 Encounter for screening mammogram for malignant neoplasm of breast: Secondary | ICD-10-CM

## 2018-02-20 ENCOUNTER — Ambulatory Visit: Payer: Self-pay

## 2018-06-10 IMAGING — CR DG CERVICAL SPINE COMPLETE 4+V
5 series · 5 of 5 positions shown · non-contrast
Comparison: None.

CLINICAL DATA: Neck and right shoulder pain.  No injury.

EXAM:
CERVICAL SPINE - COMPLETE 4+ VIEW

[w c-spine lat]
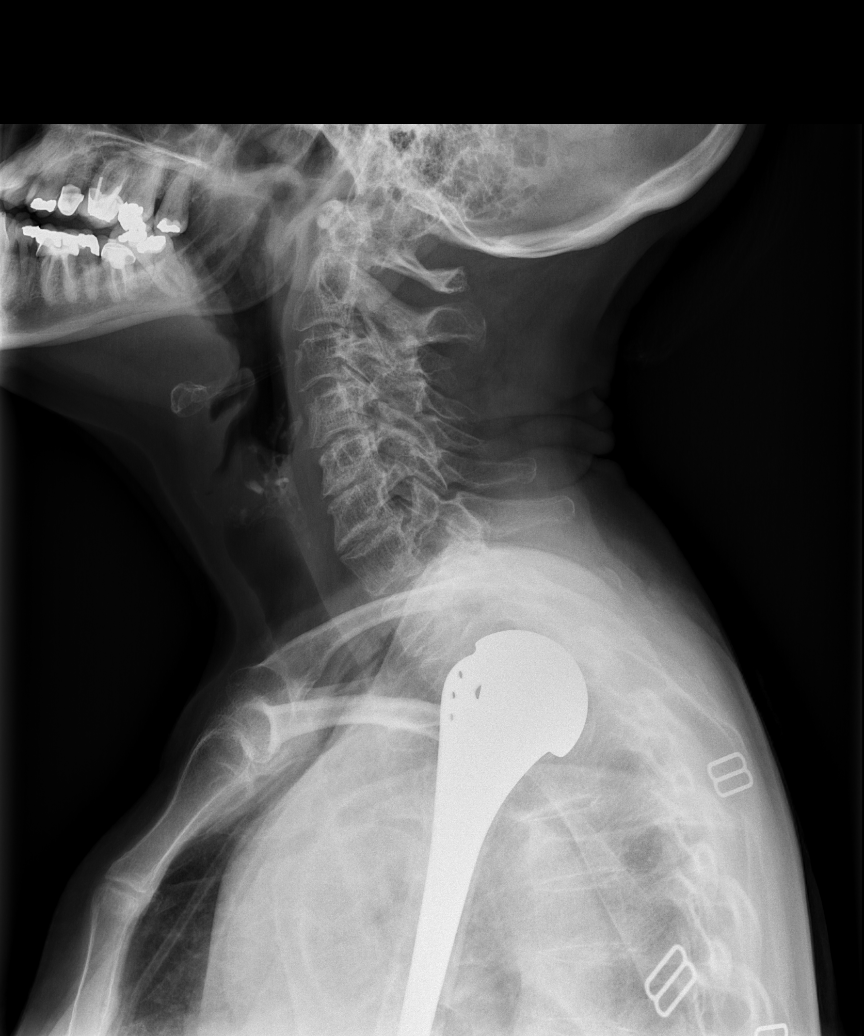

[w c-spine oblique (1 of 2)]
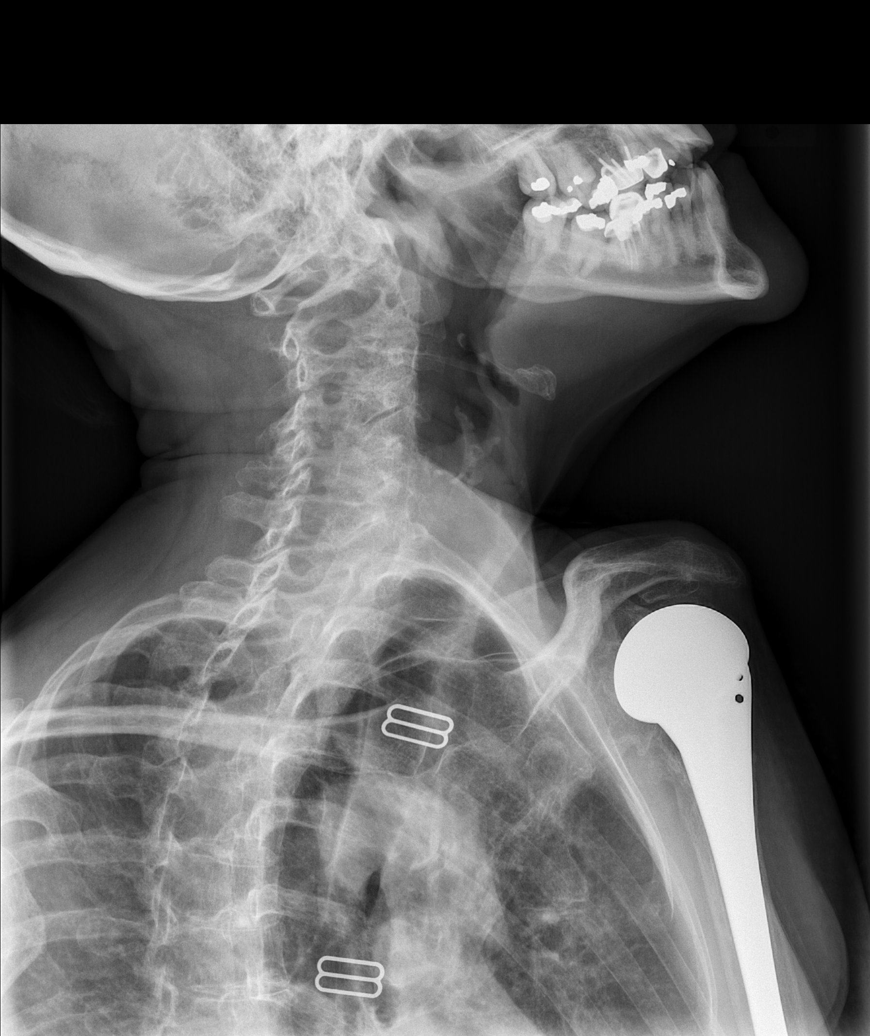

[w c-spine oblique (2 of 2)]
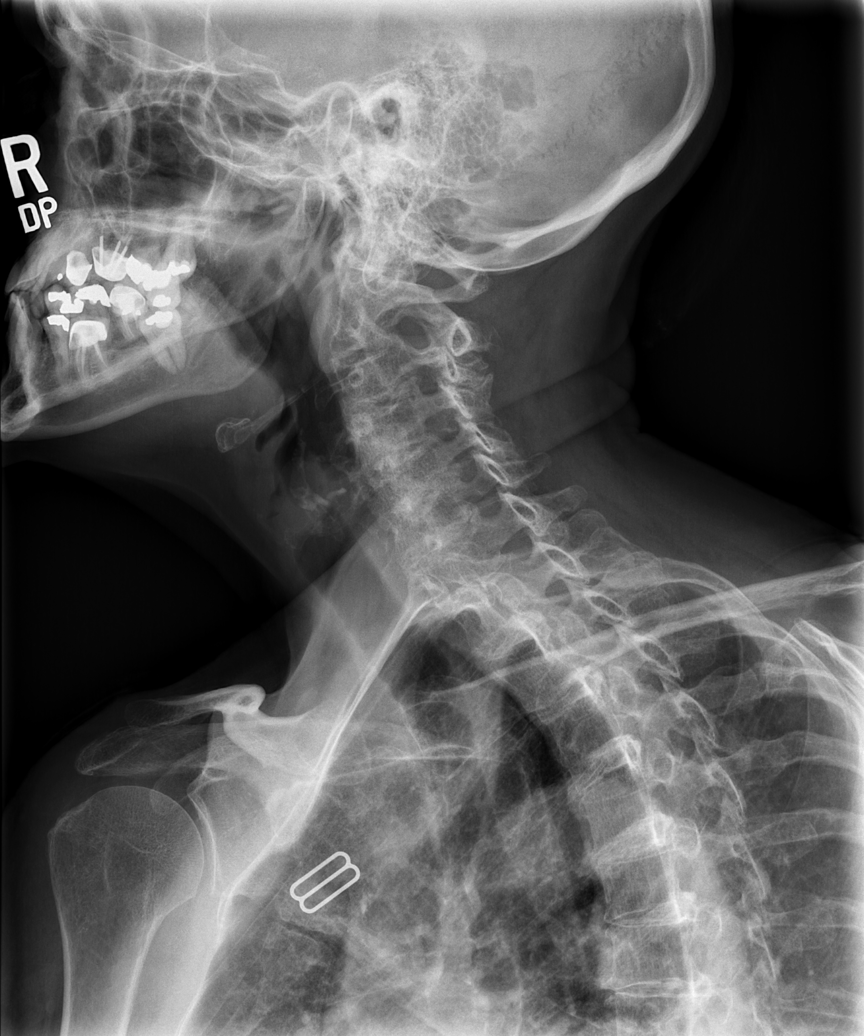

[w c-spine a.p. *]
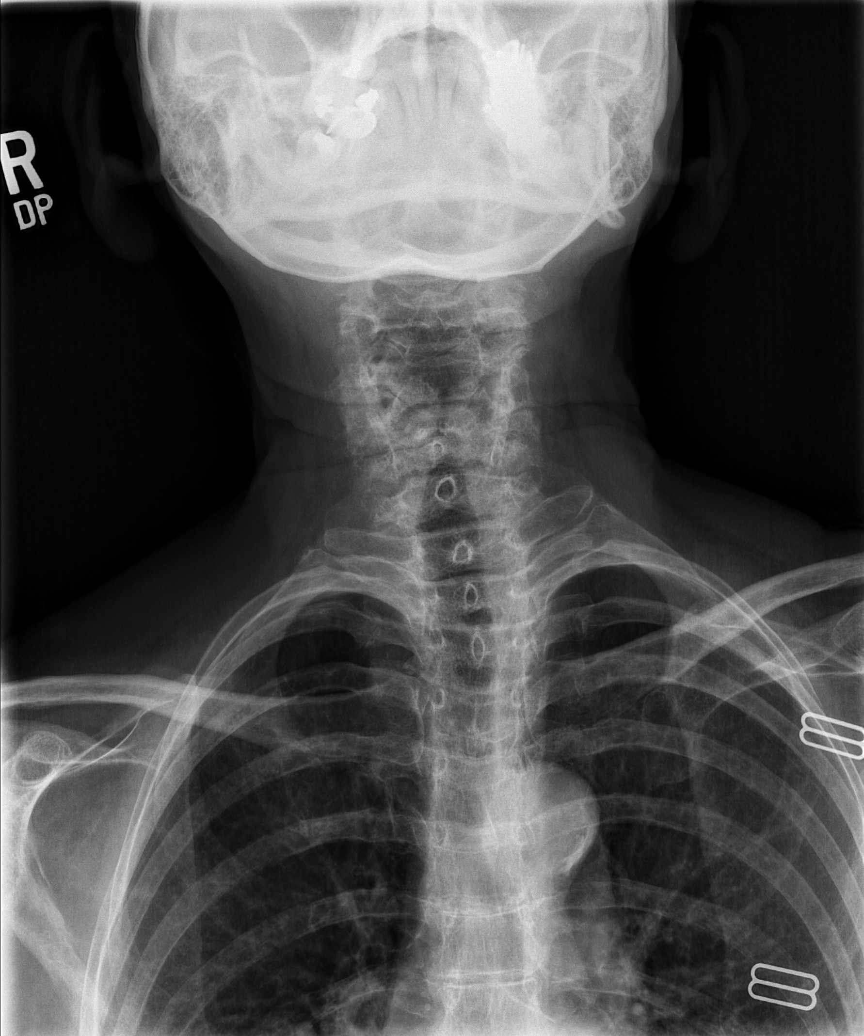

[w c-spine odontoid *]
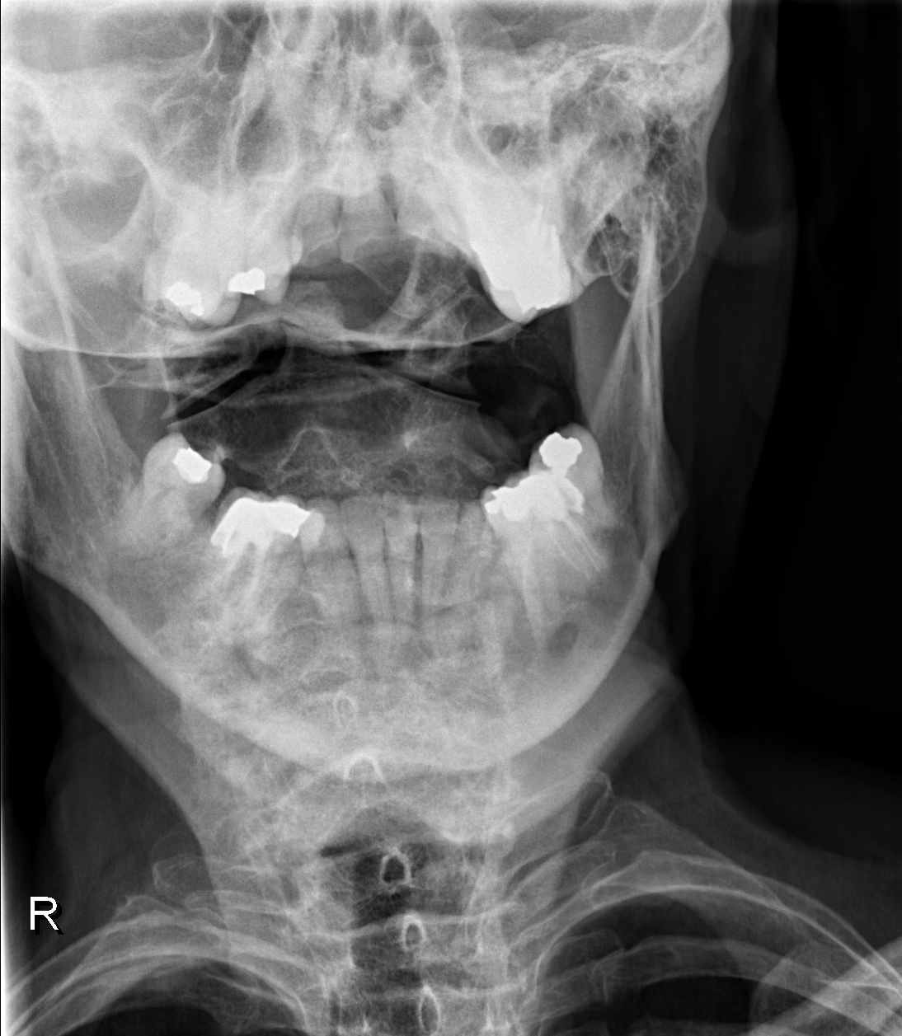

[5 of 5 positions shown; findings below may reference images not displayed]

FINDINGS: The lateral view images through the bottom of C7. Prevertebral soft
tissues are within normal limits. Maintenance of vertebral body
height and alignment. Mild to moderate spondylosis, with loss of
intervertebral disc height and endplate osteophyte formation at C5-6
C6-7. Left-sided neural foraminal narrowing at multiple levels
secondary to uncovertebral joint hypertrophy. Most significant at
C5-6 and C6-7. On the right, more mild neural foraminal narrowing
including at C3-4 at C5-6. Lateral masses partially obscured. No
gross abnormality involving the odontoid process.
IMPRESSION: Cervical spondylosis, without acute osseous finding.

## 2019-06-18 DIAGNOSIS — M25561 Pain in right knee: Secondary | ICD-10-CM | POA: Insufficient documentation

## 2019-06-19 DIAGNOSIS — M1711 Unilateral primary osteoarthritis, right knee: Secondary | ICD-10-CM | POA: Insufficient documentation

## 2019-06-19 DIAGNOSIS — M810 Age-related osteoporosis without current pathological fracture: Secondary | ICD-10-CM | POA: Insufficient documentation

## 2019-09-03 DIAGNOSIS — M79662 Pain in left lower leg: Secondary | ICD-10-CM | POA: Insufficient documentation

## 2019-09-04 DIAGNOSIS — M8580 Other specified disorders of bone density and structure, unspecified site: Secondary | ICD-10-CM | POA: Insufficient documentation

## 2019-09-04 DIAGNOSIS — D219 Benign neoplasm of connective and other soft tissue, unspecified: Secondary | ICD-10-CM | POA: Insufficient documentation

## 2020-01-27 DIAGNOSIS — M898X9 Other specified disorders of bone, unspecified site: Secondary | ICD-10-CM | POA: Insufficient documentation

## 2020-08-10 DIAGNOSIS — K649 Unspecified hemorrhoids: Secondary | ICD-10-CM | POA: Insufficient documentation

## 2020-08-10 DIAGNOSIS — M40209 Unspecified kyphosis, site unspecified: Secondary | ICD-10-CM | POA: Insufficient documentation

## 2020-08-10 DIAGNOSIS — N3941 Urge incontinence: Secondary | ICD-10-CM | POA: Insufficient documentation

## 2020-08-10 DIAGNOSIS — E7800 Pure hypercholesterolemia, unspecified: Secondary | ICD-10-CM | POA: Insufficient documentation

## 2020-08-12 ENCOUNTER — Encounter: Payer: Self-pay | Admitting: Podiatry

## 2020-08-12 ENCOUNTER — Ambulatory Visit: Payer: Medicare PPO | Admitting: Podiatry

## 2020-08-12 ENCOUNTER — Other Ambulatory Visit: Payer: Self-pay

## 2020-08-12 DIAGNOSIS — Z860100 Personal history of colon polyps, unspecified: Secondary | ICD-10-CM | POA: Insufficient documentation

## 2020-08-12 DIAGNOSIS — R0989 Other specified symptoms and signs involving the circulatory and respiratory systems: Secondary | ICD-10-CM | POA: Diagnosis not present

## 2020-08-12 DIAGNOSIS — K5730 Diverticulosis of large intestine without perforation or abscess without bleeding: Secondary | ICD-10-CM | POA: Insufficient documentation

## 2020-08-12 DIAGNOSIS — Z1211 Encounter for screening for malignant neoplasm of colon: Secondary | ICD-10-CM | POA: Insufficient documentation

## 2020-08-12 DIAGNOSIS — K6289 Other specified diseases of anus and rectum: Secondary | ICD-10-CM | POA: Insufficient documentation

## 2020-08-12 DIAGNOSIS — K5900 Constipation, unspecified: Secondary | ICD-10-CM | POA: Insufficient documentation

## 2020-08-12 DIAGNOSIS — R194 Change in bowel habit: Secondary | ICD-10-CM | POA: Insufficient documentation

## 2020-08-12 DIAGNOSIS — L97511 Non-pressure chronic ulcer of other part of right foot limited to breakdown of skin: Secondary | ICD-10-CM

## 2020-08-12 MED ORDER — NITROGLYCERIN 0.2 MG/HR TD PT24: 0.2000 mg | MEDICATED_PATCH | Freq: Every day | TRANSDERMAL | 12 refills | Status: DC

## 2020-08-12 MED ORDER — MUPIROCIN 2 % EX OINT: 1.0000 "application " | TOPICAL_OINTMENT | Freq: Two times a day (BID) | CUTANEOUS | 0 refills | Status: DC

## 2020-08-12 NOTE — Progress Notes (Signed)
Subjective:  Patient ID: Megan Stokes, female    DOB: 11/12/1950,  MRN: [id number] HPI Chief Complaint  Patient presents with  . Toe Pain    Hallux right - plantar - wound x 2 weeks, cleaning with peroxide and covering with neosporin and bandaid, PCP referred here, left hallux has a small area starting  . New Patient (Initial Visit)    70 y.o. female presents with the above complaint.   ROS: Denies fever chills nausea vomiting muscle aches pains calf pain back pain chest pain shortness of breath.  Past Medical History:  Diagnosis Date  . Hypothyroidism   . PONV (postoperative nausea and vomiting)    Past Surgical History:  Procedure Laterality Date  . BREAST BIOPSY Left   . CESAREAN SECTION     X 2  . HEMORROIDECTOMY    . SHOULDER SURGERY Left    Pt had limited movement with left shoulder/arm  . THYROID CYST EXCISION      Current Outpatient Medications:  .  Alendronate Sodium (FOSAMAX PO), Take by mouth., Disp: , Rfl:  .  mupirocin ointment (BACTROBAN) 2 %, Apply 1 application topically 2 (two) times daily., Disp: 22 g, Rfl: 0 .  nitroGLYCERIN (NITRO-DUR) 0.2 mg/hr patch, Place 1 patch (0.2 mg total) onto the skin daily., Disp: 30 patch, Rfl: 12 .  levothyroxine (SYNTHROID, LEVOTHROID) 75 MCG tablet, Take 75 mcg by mouth daily before breakfast., Disp: , Rfl:  .  lisinopril (ZESTRIL) 5 MG tablet, lisinopril 5 mg tablet  TAKE 1 TO 2 TABLETS BY MOUTH ONCE DAILY FOR BLOOD PRESSURE(GOAL IS LESS THAN 140 90), Disp: , Rfl:  .  lovastatin (MEVACOR) 40 MG tablet, lovastatin 40 mg tablet  TAKE 1 TABLET BY MOUTH ONCE DAILY, Disp: , Rfl:   Allergies  Allergen Reactions  . Codeine Nausea And Vomiting   Review of Systems Objective:  There were no vitals filed for this visit.  General: Well developed, nourished, in no acute distress, alert and oriented x3   Dermatological: Skin is warm, dry and supple bilateral. Nails x 10 are well maintained; remaining integument appears  unremarkable at this time. There are no open sores, no preulcerative lesions, no rash or signs of infection present.  She has superficial ulcerations to the plantar and plantar medial aspect of the hallux interphalangeal joint of the right foot over left.  She also has areas of cyanosis with decreased pulses bilateral foot.  There is no cellulitis no drainage no odor nothing to culture.  Vascular: Dorsalis Pedis artery and Posterior Tibial artery pedal pulses are 2/4 bilateral with immedate capillary fill time. Pedal hair growth present. No varicosities and no lower extremity edema present bilateral.   Neruologic: Grossly intact via light touch bilateral. Vibratory intact via tuning fork bilateral. Protective threshold with Semmes Wienstein monofilament intact to all pedal sites bilateral. Patellar and Achilles deep tendon reflexes 2+ bilateral. No Babinski or clonus noted bilateral.   Musculoskeletal: No gross boney pedal deformities bilateral. No pain, crepitus, or limitation noted with foot and ankle range of motion bilateral. Muscular strength 5/5 in all groups tested bilateral.  Gait: Unassisted, Nonantalgic.    Radiographs:  None taken  Assessment & Plan:   Assessment: Raynaud's disease possibly worsening mild peripheral vascular disease resulting in ischemic ulcerations hallux bilateral.  Plan: Discussed etiology pathology conservative surgical therapies discussed appropriate shoe gear wide and deep shoes.  Started her on Bactroban ointment daily cleaning Endrate daily dressing.  Also will start her on low-dose nitro  patch to the dorsal aspect of the right foot since that seems to be the worst.  Were also going to request ABIs from Dr. Carlis Abbott.     Tavi Hoogendoorn T. [place], [place]

## 2020-08-13 ENCOUNTER — Ambulatory Visit (HOSPITAL_COMMUNITY)
Admission: RE | Admit: 2020-08-13 | Discharge: 2020-08-13 | Disposition: A | Discharge status: Home / Self Care | Payer: Medicare PPO | Source: Ambulatory Visit | Attending: Podiatry | Admitting: Podiatry

## 2020-08-13 DIAGNOSIS — R0989 Other specified symptoms and signs involving the circulatory and respiratory systems: Secondary | ICD-10-CM | POA: Insufficient documentation

## 2020-08-13 DIAGNOSIS — L97511 Non-pressure chronic ulcer of other part of right foot limited to breakdown of skin: Secondary | ICD-10-CM | POA: Diagnosis present

## 2020-08-16 ENCOUNTER — Telehealth: Payer: Self-pay | Admitting: Podiatry

## 2020-08-16 ENCOUNTER — Telehealth: Payer: Self-pay | Admitting: *Deleted

## 2020-08-16 MED ORDER — NITROGLYCERIN 0.2 MG/HR TD PT24: 0.2000 mg | MEDICATED_PATCH | Freq: Every day | TRANSDERMAL | 12 refills | Status: DC

## 2020-08-16 NOTE — Telephone Encounter (Signed)
There is nothing else of this type available.See if she can get it with good Rx.

## 2020-08-16 NOTE — Telephone Encounter (Signed)
Informed patient of vascular results

## 2020-08-16 NOTE — Telephone Encounter (Signed)
-----   Message from Garrel Ridgel, [place] sent at 08/16/2020  7:00 AM EST ----- Exam was ok and will follow up with her at her next scheduled appointment.

## 2020-08-16 NOTE — Telephone Encounter (Signed)
Patient called in stating the insurance wouldn't cover the nitroGLYCERIN And has requested something else be prescribed that will be covered, Please Advise

## 2020-08-16 NOTE — Addendum Note (Signed)
Addended by: Rip Harbour on: 08/16/2020 02:51 PM   Modules accepted: Orders

## 2020-08-16 NOTE — Telephone Encounter (Signed)
Called patient-offered GoodRx program-she chose the [facility] pricing. Requested this to be sent to [facility]

## 2020-09-02 ENCOUNTER — Encounter: Payer: Self-pay | Admitting: Podiatry

## 2020-09-02 ENCOUNTER — Ambulatory Visit: Payer: Medicare PPO | Admitting: Podiatry

## 2020-09-02 ENCOUNTER — Other Ambulatory Visit: Payer: Self-pay

## 2020-09-02 DIAGNOSIS — L97511 Non-pressure chronic ulcer of other part of right foot limited to breakdown of skin: Secondary | ICD-10-CM | POA: Diagnosis not present

## 2020-09-02 NOTE — Progress Notes (Signed)
She presents today for follow-up of her ulceration states that she is doing much better everything is getting better.  Objective: Vital signs are stable is alert oriented x3 there is a absolutely no wounds present at this time.  She continues to use her Bactroban ointment.  Assessment: Well-healing vascular ulcers.  Plan: At this point I recommended she continue to use her ointment and also silicone padding.  I will follow-up with her on an as-needed basis.

## 2021-04-14 DIAGNOSIS — R339 Retention of urine, unspecified: Secondary | ICD-10-CM | POA: Insufficient documentation

## 2021-07-05 LAB — HM MAMMOGRAPHY

## 2021-09-21 ENCOUNTER — Ambulatory Visit (INDEPENDENT_AMBULATORY_CARE_PROVIDER_SITE_OTHER): Payer: Medicare PPO | Admitting: Family Medicine

## 2021-09-21 ENCOUNTER — Encounter: Payer: Self-pay | Admitting: Family Medicine

## 2021-09-21 VITALS — BP 160/90 | HR 71 | Temp 98.3°F | Ht 61.0 in | Wt 112.0 lb

## 2021-09-21 DIAGNOSIS — E039 Hypothyroidism, unspecified: Secondary | ICD-10-CM

## 2021-09-21 DIAGNOSIS — M810 Age-related osteoporosis without current pathological fracture: Secondary | ICD-10-CM

## 2021-09-21 DIAGNOSIS — M4316 Spondylolisthesis, lumbar region: Secondary | ICD-10-CM

## 2021-09-21 DIAGNOSIS — I10 Essential (primary) hypertension: Secondary | ICD-10-CM | POA: Diagnosis not present

## 2021-09-21 MED ORDER — HYDROCHLOROTHIAZIDE 12.5 MG PO TABS: 12.5000 mg | ORAL_TABLET | Freq: Every day | ORAL | 1 refills | Status: DC

## 2021-09-21 MED ORDER — MELOXICAM 7.5 MG PO TABS: 7.5000 mg | ORAL_TABLET | Freq: Every day | ORAL | 0 refills | Status: DC

## 2021-09-21 MED ORDER — LISINOPRIL 20 MG PO TABS: 20.0000 mg | ORAL_TABLET | Freq: Every day | ORAL | 3 refills | Status: DC

## 2021-09-21 NOTE — Assessment & Plan Note (Signed)
Recurrent low back pain. S/p surgery. Offered referral for injection, surgery, pt - declined. At this point will try NSAIDs (meloxicam) x 2 weeks and recommend PT if no improvement.  ?

## 2021-09-21 NOTE — Patient Instructions (Addendum)
#  Back pain ?- meloxicam daily for 7-14 days ?- do not take ibuprofen or aleve ?- ok to use Icy/hot ?- if no improvement in 2 weeks - would recommend PT or could refer to neurosurgery ? ?#Blood pressure ?- continue lisinopril ?- start Hydrochlorothiazide 12.5 mg ?- check blood pressure at home ?- return for labs in 2 weeks ?- Mychart update in 2-3 weeks  ? ? ?Your blood pressure high.  ? ?High blood pressure increases your risk for heart attack and stroke.  ? ? ?Please check your blood pressure 2-4 times a week.  ? ?To check your blood pressure ?1) Sit in a quiet and relaxed place for 5 minutes ?2) Make sure your feet are flat on the ground ?3) Consider checking first thing in the morning  ? ?Normal blood pressure is less than 140/90 ?Ideally you blood pressure should be around 120/80 ? ? ?

## 2021-09-21 NOTE — Progress Notes (Signed)
? ?Subjective:  ? ?  ?Megan Stokes is a 71 y.o. female presenting for Establish Care (Will request records for mammo, bone density, shingrix and pneumo vaccines.) and Sciatica (Lumbar pain down L leg. ) ?  ? ? ?Back Pain ?This is a recurrent problem. The current episode started 1 to 4 weeks ago. The problem is unchanged. The pain is present in the lumbar spine. The pain radiates to the left foot, left knee and left thigh (head). The pain is at a severity of 7/10. The symptoms are aggravated by bending. Associated symptoms include headaches, numbness, tingling and weakness. Pertinent negatives include no bladder incontinence or bowel incontinence. Risk factors: hx of surgery. She has tried heat (sitting down, icy hot) for the symptoms.  ? ?Hx of back fusion w/ lumbar spine - plate and screws ? ?Went to PT a long time ago - not sure if worked, limited with  ? ?#HTN ?- taking lisinopril 5 mg  ?- no cp, sob, HA ?- does not check at home ? ? ?-  ? ?Review of Systems  ?Gastrointestinal:  Negative for bowel incontinence.  ?Genitourinary:  Negative for bladder incontinence.  ?Musculoskeletal:  Positive for back pain.  ?Neurological:  Positive for tingling, weakness, numbness and headaches.  ? ? ?Social History  ? ?Tobacco Use  ?Smoking Status Former  ?Smokeless Tobacco Never  ? ? ? ?   ?Objective:  ?  ?BP Readings from Last 3 Encounters:  ?09/21/21 (!) 160/90  ?02/14/14 118/60  ?02/05/14 (!) 178/80  ? ?Wt Readings from Last 3 Encounters:  ?09/21/21 112 lb (50.8 kg)  ?02/12/14 120 lb (54.4 kg)  ?02/05/14 252 lb 10.3 oz (114.6 kg)  ? ? ?BP (!) 160/90   Pulse 71   Temp 98.3 ?F (36.8 ?C) (Oral)   Ht '5\' 1"'$  (1.549 m)   Wt 112 lb (50.8 kg)   SpO2 98%   BMI 21.16 kg/m?  ? ? ?Physical Exam ?Constitutional:   ?   General: She is not in acute distress. ?   Appearance: She is well-developed. She is not diaphoretic.  ?HENT:  ?   Right Ear: External ear normal.  ?   Left Ear: External ear normal.  ?   Nose: Nose normal.   ?Eyes:  ?   Conjunctiva/sclera: Conjunctivae normal.  ?Cardiovascular:  ?   Rate and Rhythm: Normal rate and regular rhythm.  ?   Heart sounds: No murmur heard. ?Pulmonary:  ?   Effort: Pulmonary effort is normal. No respiratory distress.  ?   Breath sounds: Normal breath sounds. No wheezing.  ?Musculoskeletal:  ?   Cervical back: Neck supple.  ?   Comments: Back ?Inspection: no abnormalities ?Palpation: no spinous or paraspinous ttp ?ROM: normal w/o pain ?Strength: normal ?Straight leg raise: negative  ?Skin: ?   General: Skin is warm and dry.  ?   Capillary Refill: Capillary refill takes less than 2 seconds.  ?Neurological:  ?   Mental Status: She is alert. Mental status is at baseline.  ?Psychiatric:     ?   Mood and Affect: Mood normal.     ?   Behavior: Behavior normal.  ? ? ? ? ? ?   ?Assessment & Plan:  ? ?Problem List Items Addressed This Visit   ? ?  ? Cardiovascular and Mediastinum  ? Essential hypertension - Primary  ?  Not at goal. Decrease alcohol. Cont lisinopril 20 mg. Start HCTZ 12.5 mg. Return for labs in 2 weeks. OV  in 4 weeks.  ?  ?  ? Relevant Medications  ? lisinopril (ZESTRIL) 20 MG tablet  ? hydrochlorothiazide (HYDRODIURIL) 12.5 MG tablet  ? Other Relevant Orders  ? Basic metabolic panel  ?  ? Endocrine  ? Hypothyroidism  ?  Stable. Cont levo 75 mcg ?  ?  ?  ? Musculoskeletal and Integument  ? Spondylolisthesis of lumbar region  ?  Recurrent low back pain. S/p surgery. Offered referral for injection, surgery, pt - declined. At this point will try NSAIDs (meloxicam) x 2 weeks and recommend PT if no improvement.  ?  ?  ? Relevant Medications  ? meloxicam (MOBIC) 7.5 MG tablet  ? Osteoporosis  ?  Doing well. Cont fosamax. Cont vit D and walking daily. Request outside dexa and anticipate repeating this year.  ?  ?  ? ? ? ?Return in about 4 weeks (around 10/19/2021) for blood pressure. ? ?Lesleigh Noe, MD ? ?This visit occurred during the SARS-CoV-2 public health emergency.  Safety protocols were  in place, including screening questions prior to the visit, additional usage of staff PPE, and extensive cleaning of exam room while observing appropriate contact time as indicated for disinfecting solutions.  ? ?

## 2021-09-21 NOTE — Assessment & Plan Note (Addendum)
Doing well. Cont fosamax. Cont vit D and walking daily. Request outside dexa and anticipate repeating this year.  ?

## 2021-09-21 NOTE — Assessment & Plan Note (Signed)
Not at goal. Decrease alcohol. Cont lisinopril 20 mg. Start HCTZ 12.5 mg. Return for labs in 2 weeks. OV in 4 weeks.  ?

## 2021-09-21 NOTE — Assessment & Plan Note (Signed)
Stable. Cont levo 75 mcg ?

## 2021-09-28 ENCOUNTER — Telehealth: Payer: Self-pay

## 2021-09-28 MED ORDER — LOVASTATIN 40 MG PO TABS: ORAL_TABLET | ORAL | 1 refills | Status: DC

## 2021-09-28 MED ORDER — ALENDRONATE SODIUM 70 MG PO TABS: 70.0000 mg | ORAL_TABLET | ORAL | 1 refills | Status: DC

## 2021-09-28 NOTE — Telephone Encounter (Signed)
New message     1. Which medications need to be refilled? (please list name of each medication and dose if known) lovastatin (MEVACOR) 40 MG tablet  2. Which pharmacy/location (including street and city if local pharmacy) is medication to be sent to?[place] ([place]), [place] - [address]  3. Do they need a 30 day or 90 day supply? 90 days supply    1. Which medications need to be refilled? (please list name of each medication and dose if known) Alendronate Sodium (FOSAMAX PO) 70 mg   2. Which pharmacy/location (including street and city if local pharmacy) is medication to be sent to?[facility] ([facility]), [place] - [address]  3. Do they need a 30 day or 90 day supply? 90 day supply

## 2021-09-28 NOTE — Telephone Encounter (Signed)
Both last filled by a historical provider 08/12/20 ?Last OV: 09/21/21 ?No future appt scheduled ?

## 2021-10-05 MED ORDER — LOVASTATIN 40 MG PO TABS: ORAL_TABLET | ORAL | 1 refills | Status: DC

## 2021-10-05 NOTE — Telephone Encounter (Signed)
Pt called stating that she did not receive medication lovastatin (MEVACOR) 40 MG tablet. Pt states that [facility] stated that they didn't received that prescription. Pt states that she only have 3 pills left and that she needs that medication. Please advise. ?

## 2021-10-05 NOTE — Telephone Encounter (Signed)
Refill resent in and pt notified on VM (DPR) ?

## 2021-10-05 NOTE — Addendum Note (Signed)
Addended by: Loreen Freud on: 10/05/2021 04:08 PM ? ? Modules accepted: Orders ? ?

## 2021-10-09 DIAGNOSIS — Z87448 Personal history of other diseases of urinary system: Secondary | ICD-10-CM | POA: Insufficient documentation

## 2021-10-19 ENCOUNTER — Telehealth: Payer: Self-pay | Admitting: Family Medicine

## 2021-10-19 ENCOUNTER — Other Ambulatory Visit (INDEPENDENT_AMBULATORY_CARE_PROVIDER_SITE_OTHER): Payer: Medicare PPO

## 2021-10-19 DIAGNOSIS — I10 Essential (primary) hypertension: Secondary | ICD-10-CM | POA: Diagnosis not present

## 2021-10-19 LAB — BASIC METABOLIC PANEL
BUN: 13 mg/dL (ref 6–23)
CO2: 28 mEq/L (ref 19–32)
Calcium: 8.8 mg/dL (ref 8.4–10.5)
Chloride: 100 mEq/L (ref 96–112)
Creatinine, Ser: 0.65 mg/dL (ref 0.40–1.20)
GFR: 87.71 mL/min (ref >60.00)
Glucose, Bld: 79 mg/dL (ref 70–99)
Potassium: 3.8 mEq/L (ref 3.5–5.1)
Sodium: 135 mEq/L (ref 135–145)

## 2021-10-19 NOTE — Telephone Encounter (Signed)
Pt asked for a call back to discuss labs once they have been reviewed. She has mychart but said shes not able to get in to review. Call back is [phone]. ?

## 2021-10-20 NOTE — Telephone Encounter (Signed)
Spoke to pt and relayed lab results, per Allie Bossier ?

## 2021-10-25 ENCOUNTER — Ambulatory Visit (INDEPENDENT_AMBULATORY_CARE_PROVIDER_SITE_OTHER): Payer: Medicare PPO | Admitting: Family Medicine

## 2021-10-25 VITALS — BP 132/70 | HR 52 | Temp 97.8°F | Ht 61.0 in | Wt 109.3 lb

## 2021-10-25 DIAGNOSIS — M4316 Spondylolisthesis, lumbar region: Secondary | ICD-10-CM | POA: Diagnosis not present

## 2021-10-25 DIAGNOSIS — N814 Uterovaginal prolapse, unspecified: Secondary | ICD-10-CM | POA: Diagnosis not present

## 2021-10-25 DIAGNOSIS — M810 Age-related osteoporosis without current pathological fracture: Secondary | ICD-10-CM

## 2021-10-25 DIAGNOSIS — I10 Essential (primary) hypertension: Secondary | ICD-10-CM | POA: Diagnosis not present

## 2021-10-25 DIAGNOSIS — R109 Unspecified abdominal pain: Secondary | ICD-10-CM | POA: Insufficient documentation

## 2021-10-25 MED ORDER — MELOXICAM 7.5 MG PO TABS: 7.5000 mg | ORAL_TABLET | Freq: Every day | ORAL | 0 refills | Status: DC

## 2021-10-25 NOTE — Assessment & Plan Note (Signed)
Discussed concern that Fosamax may be the cause of her abdominal spells, recommended consideration for taking an antacid on the day she takes her Fosamax.  As well as tracking abdominal symptoms to see if they do correlate. ?

## 2021-10-25 NOTE — Assessment & Plan Note (Signed)
Refill of meloxicam as she did respond well to this.  Discussed decreasing use to just as needed for more significant pain.  As well as risk for kidney and heart disease with chronic use of anti-inflammatories. ?

## 2021-10-25 NOTE — Assessment & Plan Note (Signed)
Unclear if this is associated with some difficulty passing bowel movements.  But the symptoms are bothersome.  She failed a pessary and does not want this is an option.  Discussed getting a second opinion to see if surgery would be an option and referral to urogyn placed.  Also discussed pelvic floor physical therapy as an option if surgery is not indicated. ?

## 2021-10-25 NOTE — Patient Instructions (Addendum)
Consider taking an antacid when you take fosamax ? ?With abdominal attacks ?- when was your last bowel movement? Have you been straining?  ?- when did you take fosamax?  ?- does a bowel relieve the pain?  ?- update if more frequent or not going away or worsening ? ?OK for ibuprofen as long as no meloxicam that day - otherwise tylenol ? ? ?Constipation  ? ?Constipation is a common issue. Often it is related to diet and occasionally medications.  ? ? ? ?What you can do to treat your symptoms ?1) Fiber ?-- Eat more fiber rich foods: beans, broccoli, berries, avocados, popcorn, pear/apple, green peas, turnip greens, brussels sprouts, whole grains (barley, bran, quinoa, oatmeal) ?-- Could also eat Prunes daily ? ?2) Hydration  ?-- Drink more water: Try to drink 64 oz of water per day ? ?3) Exercise ?-- Moderate exercise (walking, jogging, biking) for 30 minutes, 5 days a week ? ?4) Dedicate time for Bowel movements - do not delay ? ?5) Stool Softener  ?- Docusate Sodium (Colace) 100 mg daily or twice daily as needed ? ? ?If 4-6 weeks have passed and the above has not helped then start the following ?6) Laxatives ?-- Polyethylene Glycol (Miralax) - begin with once daily. After a few days can increase to twice daily ?Or ?-- Magnesium Citrate -- Common side effect is nausea and diarrhea -- can try if still not improved ? ? ? ?Treating chronic constipation is often about finding the right amount of medication and fiber to keep you regular and comfortable. For some people that may be daily metamucil and colace every other day. For others it may be Metamucil and colace twice daily and Miralax 3 times a week. The goal is to go slow and listen to your body. And normal can be anywhere from 2-3 soft bowel movements a day to 1 bowel movement every 2-3 days.  ? ?

## 2021-10-25 NOTE — Assessment & Plan Note (Signed)
Blood pressure improved, suspect elevated number at last visit was secondary to pain.  Continue lisinopril 20 mg as she did just not been taking the hydrochlorothiazide. ?

## 2021-10-25 NOTE — Assessment & Plan Note (Signed)
She notes spells which occur infrequently and resolve with anti-inflammatories.  These are associated with nausea.  Occasionally resolved with BMs.  Suspect this could be constipation versus Fosamax side effects.  Handout on constipation provided and advised that she keep a diary to try to clarify possible cause.  Do not think it is diverticulitis as I do not think that would resolve within a short timeframe.  Given lack of symptoms today and in frequency do not feel work-up at this time would be helpful. ?

## 2021-10-25 NOTE — Progress Notes (Signed)
? ?Subjective:  ? ?  ?Megan Stokes is a 71 y.o. female presenting for Follow-up (HT/Requesting records for care gaps) ?  ? ? ?HPI ? ?#HTN ?- feeling well ?- checking at home 130s/70s ?- has had some work going on at home ?- never started the hctz ?- has been taking lisinopril  ?- is taking meloxicam ? ?#Back pain  ?- is improving ?- meloxicam is helping ? ?#stomach spells ?- from chest to the groin ?- horrible pain ?- has to lower her pants and bra down ?- cannot tolerate anything touching her ?- will go up to 6 months w/o getting an episode ?- will last for 3-6 hours ?- will take ibuprofen w/ improvement ?- associated with nausea ?- tires to go to the bathroom when it occurs - had 3 BM on Saturday and eventually got better ?- had an episode on Saturday and took several ?- pain starts in the left lower quadrant ? ?Prolapse ?- failed pessary ?- told she could not get surgery ?- thinks this may impact BM ?- no urine incontinence  ? ?Review of Systems ? ? ?09/21/2021: Clinic - HTN - decrease alcohol, cont lisinopril, start HCTZ ? ?Social History  ? ?Tobacco Use  ?Smoking Status Former  ?Smokeless Tobacco Never  ? ? ? ?   ?Objective:  ?  ?BP Readings from Last 3 Encounters:  ?10/25/21 132/70  ?09/21/21 (!) 160/90  ?02/14/14 118/60  ? ?Wt Readings from Last 3 Encounters:  ?10/25/21 109 lb 5 oz (49.6 kg)  ?09/21/21 112 lb (50.8 kg)  ?02/12/14 120 lb (54.4 kg)  ? ? ?BP 132/70   Pulse (!) 52   Temp 97.8 ?F (36.6 ?C) (Oral)   Ht '5\' 1"'$  (1.549 m)   Wt 109 lb 5 oz (49.6 kg)   SpO2 100%   BMI 20.65 kg/m?  ? ? ?Physical Exam ?Constitutional:   ?   General: She is not in acute distress. ?   Appearance: She is well-developed. She is not diaphoretic.  ?HENT:  ?   Right Ear: External ear normal.  ?   Left Ear: External ear normal.  ?   Nose: Nose normal.  ?Eyes:  ?   Conjunctiva/sclera: Conjunctivae normal.  ?Cardiovascular:  ?   Rate and Rhythm: Normal rate and regular rhythm.  ?Pulmonary:  ?   Effort: Pulmonary effort is  normal.  ?   Breath sounds: Normal breath sounds.  ?Abdominal:  ?   General: Abdomen is flat. Bowel sounds are normal. There is no distension.  ?   Palpations: Abdomen is soft.  ?   Tenderness: There is no abdominal tenderness. There is no guarding or rebound.  ?Musculoskeletal:  ?   Cervical back: Neck supple.  ?Skin: ?   General: Skin is warm and dry.  ?   Capillary Refill: Capillary refill takes less than 2 seconds.  ?Neurological:  ?   Mental Status: She is alert. Mental status is at baseline.  ?Psychiatric:     ?   Mood and Affect: Mood normal.     ?   Behavior: Behavior normal.  ? ? ? ? ? ?   ?Assessment & Plan:  ? ?Problem List Items Addressed This Visit   ? ?  ? Cardiovascular and Mediastinum  ? Essential hypertension - Primary  ?  Blood pressure improved, suspect elevated number at last visit was secondary to pain.  Continue lisinopril 20 mg as she did just not been taking the hydrochlorothiazide. ? ?  ?  ?  ?  Musculoskeletal and Integument  ? Spondylolisthesis of lumbar region  ?  Refill of meloxicam as she did respond well to this.  Discussed decreasing use to just as needed for more significant pain.  As well as risk for kidney and heart disease with chronic use of anti-inflammatories. ? ?  ?  ? Relevant Medications  ? meloxicam (MOBIC) 7.5 MG tablet  ? Osteoporosis  ?  Discussed concern that Fosamax may be the cause of her abdominal spells, recommended consideration for taking an antacid on the day she takes her Fosamax.  As well as tracking abdominal symptoms to see if they do correlate. ? ?  ?  ?  ? Genitourinary  ? Cystocele with prolapse  ?  Unclear if this is associated with some difficulty passing bowel movements.  But the symptoms are bothersome.  She failed a pessary and does not want this is an option.  Discussed getting a second opinion to see if surgery would be an option and referral to urogyn placed.  Also discussed pelvic floor physical therapy as an option if surgery is not indicated. ? ?   ?  ? Relevant Orders  ? Ambulatory referral to Urogynecology  ?  ? Other  ? Abdominal pain  ?  She notes spells which occur infrequently and resolve with anti-inflammatories.  These are associated with nausea.  Occasionally resolved with BMs.  Suspect this could be constipation versus Fosamax side effects.  Handout on constipation provided and advised that she keep a diary to try to clarify possible cause.  Do not think it is diverticulitis as I do not think that would resolve within a short timeframe.  Given lack of symptoms today and in frequency do not feel work-up at this time would be helpful. ? ?  ?  ? ? ? ?Return in about 6 months (around 04/27/2022) for annual exam. ? ?Lesleigh Noe, MD ? ? ? ?

## 2021-10-28 ENCOUNTER — Encounter: Payer: Self-pay | Admitting: Family Medicine

## 2021-11-02 ENCOUNTER — Other Ambulatory Visit: Payer: Self-pay

## 2021-11-02 MED ORDER — LISINOPRIL 20 MG PO TABS: 20.0000 mg | ORAL_TABLET | Freq: Every day | ORAL | 3 refills | Status: DC

## 2021-11-02 NOTE — Telephone Encounter (Signed)
Pt needs refill of lisinopril '20mg'$  90 day supply sent to [facility]. ?

## 2021-11-09 ENCOUNTER — Telehealth: Payer: Self-pay | Admitting: Family Medicine

## 2021-11-09 NOTE — Telephone Encounter (Signed)
Pt is requesting a call back from nurse about some questions regarding the specialist that Dr Einar Pheasant sent her too. Callback is [phone]

## 2021-12-01 NOTE — Telephone Encounter (Signed)
Faxed  Pt notified via mychart

## 2021-12-07 ENCOUNTER — Encounter: Payer: Self-pay | Admitting: Family Medicine

## 2021-12-18 ENCOUNTER — Ambulatory Visit
Admission: EM | Admit: 2021-12-18 | Discharge: 2021-12-18 | Disposition: A | Discharge status: Home / Self Care | Payer: Medicare PPO | Attending: Internal Medicine | Admitting: Internal Medicine

## 2021-12-18 DIAGNOSIS — W5501XA Bitten by cat, initial encounter: Secondary | ICD-10-CM

## 2021-12-18 DIAGNOSIS — W5503XA Scratched by cat, initial encounter: Secondary | ICD-10-CM | POA: Diagnosis not present

## 2021-12-18 DIAGNOSIS — R030 Elevated blood-pressure reading, without diagnosis of hypertension: Secondary | ICD-10-CM | POA: Diagnosis not present

## 2021-12-18 DIAGNOSIS — S81852A Open bite, left lower leg, initial encounter: Secondary | ICD-10-CM

## 2021-12-18 MED ORDER — AMOXICILLIN-POT CLAVULANATE 875-125 MG PO TABS: 1.0000 | ORAL_TABLET | Freq: Two times a day (BID) | ORAL | 0 refills | Status: DC

## 2021-12-18 NOTE — Discharge Instructions (Signed)
You have been prescribed antibiotic for your cat bite/scratch.  Please monitor very closely for any increased redness, swelling, pus, fever.  Please follow-up if this occurs.  Please keep covered until healed over. You also have elevated blood pressure reading in urgent care so please keep an eye on this at home with home blood pressure readings.  Please follow-up if it remains elevated.

## 2021-12-18 NOTE — ED Notes (Signed)
Nonadhearent dressing applied to wounds, cleansed with chg

## 2021-12-18 NOTE — ED Triage Notes (Signed)
Patient presents to Urgent Care with complaints of cat scratch/ bite on L leg since yesterday. Patient reports it was her cat that attacked her yesterday when she was trying to let her dog out and the cat saw another dog outside and attacked her. Pt st 6/10 pain, swelling and redness noted at site. Pt has been applying bacitracin oitment.

## 2021-12-18 NOTE — ED Provider Notes (Signed)
[name]    CSN: [id number] Arrival date & time: 12/18/21  0931      History   Chief Complaint Chief Complaint  Patient presents with   cat scratch     HPI Megan Stokes is a 71 y.o. female.   Patient presents with cat bite/scratch on left lower leg that occurred yesterday.  Patient reports that she thinks that the cat bit her and scratched her.  She states that she was standing at her door when the cat got spooked by another dog and attacked her.  She states that it is her own cat and is up-to-date on rabies vaccinations.  She has applied bacitracin ointment with minimal improvement.  She is concerned because she has had increased swelling and redness.  Denies any numbness or tingling.  Patient has full range of motion of leg.  Patient reports her last tetanus vaccine is within 5 years.  Patient also has elevated blood pressure reading but reports that she typically has elevated blood pressure in the mornings.  Denies chest pain, shortness of breath, headache, blurred vision, nausea, vomiting.  She monitors at home and reports that it trends up in the morning and then drops in the afternoon.     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      Home Medications    Prior to Admission medications   Medication Sig Start Date End Date Taking? Authorizing Provider  amoxicillin-clavulanate (AUGMENTIN) 875-125 MG tablet Take 1 tablet by mouth every 12 (twelve) hours. 12/18/21  Yes Lavel Rieman, Michele Rockers, FNP  alendronate (FOSAMAX) 70 MG tablet Take 1 tablet (70 mg total) by mouth once a week. 09/28/21   Lesleigh Noe, MD  levothyroxine (SYNTHROID, LEVOTHROID) 75 MCG tablet Take 75 mcg by mouth daily before breakfast.    [provider]  lisinopril (ZESTRIL) 20 MG tablet Take 1 tablet (20 mg total) by mouth daily. 11/02/21   Lesleigh Noe, MD  lovastatin (MEVACOR) 40 MG tablet TAKE 1 TABLET BY MOUTH ONCE DAILY 10/05/21   Lesleigh Noe, MD  meloxicam (MOBIC) 7.5 MG tablet Take 1 tablet (7.5 mg total) by mouth daily. 10/25/21   Lesleigh Noe, MD    Family History Family History  Problem Relation Age of Onset   Stroke Mother     Social History Social History   Tobacco Use   Smoking status: Former   Smokeless tobacco: Never  Scientific laboratory technician Use: Never used  Substance Use Topics   Alcohol use: Yes  Alcohol/week: 2.0 - 3.0 standard drinks of alcohol    Types: 2 - 3 Glasses of wine per week    Comment: 2 glasses nightly   Drug use: No     Allergies   Codeine   Review of Systems Review of Systems Per HPI  Physical Exam Triage Vital Signs ED Triage Vitals  Enc Vitals Group     BP 12/18/21 1018 (!) 179/76     Pulse Rate 12/18/21 1018 72     Resp 12/18/21 1018 15     Temp 12/18/21 1018 97.9 F (36.6 C)     Temp src --      SpO2 12/18/21 1018 98 %     Weight --      Height --      Head Circumference --      Peak Flow --      Pain Score 12/18/21 1021 6     Pain Loc --      Pain Edu? --      Excl. in [place]? --    No data found.  Updated Vital Signs BP (!) 179/76   Pulse 72    Temp 97.9 F (36.6 C)   Resp 15   SpO2 98%   Visual Acuity Right Eye Distance:   Left Eye Distance:   Bilateral Distance:    Right Eye Near:   Left Eye Near:    Bilateral Near:     Physical Exam Constitutional:      General: She is not in acute distress.    Appearance: Normal appearance. She is not toxic-appearing or diaphoretic.  HENT:     Head: Normocephalic and atraumatic.  Eyes:     Extraocular Movements: Extraocular movements intact.     Conjunctiva/sclera: Conjunctivae normal.     Pupils: Pupils are equal, round, and reactive to light.  Cardiovascular:     Rate and Rhythm: Normal rate and regular rhythm.     Pulses: Normal pulses.     Heart sounds: Normal heart sounds.  Pulmonary:     Effort: Pulmonary effort is normal. No respiratory distress.     Breath sounds: Normal breath sounds.  Skin:    Comments: There are several different puncture/superficial laceration scratch marks throughout left upper leg that is circumferential directly below knee.  Area of erythema that is on posterior leg directly below knee with superficial linear laceration with close approximated wound edges.  No obvious purulent drainage.  Neurovascular intact.  Patient has full range of motion of lower extremity.  Neurological:     General: No focal deficit present.     Mental Status: She is alert and oriented to person, place, and time. Mental status is at baseline.     Cranial Nerves: Cranial nerves 2-12 are intact.     Sensory: Sensation is intact.     Motor: Motor function is intact.     Coordination: Coordination is intact.     Gait: Gait is intact.  Psychiatric:        Mood and Affect: Mood normal.        Behavior: Behavior normal.        Thought Content: Thought content normal.        Judgment: Judgment normal.      UC Treatments / Results  Labs (all labs ordered are listed, but only abnormal results are displayed) Labs Reviewed - No data to display  EKG   Radiology No  results found.  Procedures Procedures (including critical care time)  Medications Ordered in UC Medications - No data to display  Initial Impression / Assessment and Plan / UC Course  I have reviewed the triage vital signs and the nursing notes.  Pertinent labs & imaging results that were available during my care of the patient were reviewed by me and considered in my medical decision making (see chart for details).     Weill treat cat bite with Augmentin antibiotic.  There is concern for cellulitis on posterior leg.  No need for sutures or wound repair given that wound edges are close approximated and are superficial.  Dressing was applied to area of concern.  Tetanus vaccine not necessary given that patient has had it within 5 years.  Patient reports that animal is up-to-date on rabies vaccinations so do not think that rabies vaccination is necessary at this time.  Patient advised to monitor for increased redness, swelling, pus and to follow-up if this occurs.  Also advised to follow-up if fever, body aches, chills occur.  Patient also has elevated blood pressure reading.  She states this is normal for her.  Advised patient to monitor very closely at home and follow-up if it remains elevated.  No signs of endorgan damage on exam and neuro exam is normal.  Patient verbalized understanding and was agreeable with plan. Final Clinical Impressions(s) / UC Diagnoses   Final diagnoses:  Cat bite of left lower leg, initial encounter  Cat scratch  Elevated blood pressure reading     Discharge Instructions      You have been prescribed antibiotic for your cat bite/scratch.  Please monitor very closely for any increased redness, swelling, pus, fever.  Please follow-up if this occurs.  Please keep covered until healed over. You also have elevated blood pressure reading in urgent care so please keep an eye on this at home with home blood pressure readings.  Please follow-up if it remains  elevated.    ED Prescriptions     Medication Sig Dispense Auth. Provider   amoxicillin-clavulanate (AUGMENTIN) 875-125 MG tablet Take 1 tablet by mouth every 12 (twelve) hours. 14 tablet [place], Michele Rockers, [place]      PDMP not reviewed this encounter.   Teodora Medici, [place] 12/18/21 1143

## 2021-12-20 ENCOUNTER — Ambulatory Visit
Admission: EM | Admit: 2021-12-20 | Discharge: 2021-12-20 | Disposition: A | Discharge status: Home / Self Care | Payer: Medicare PPO | Attending: Student | Admitting: Student

## 2021-12-20 DIAGNOSIS — R112 Nausea with vomiting, unspecified: Secondary | ICD-10-CM | POA: Diagnosis not present

## 2021-12-20 DIAGNOSIS — R197 Diarrhea, unspecified: Secondary | ICD-10-CM

## 2021-12-20 DIAGNOSIS — T50905A Adverse effect of unspecified drugs, medicaments and biological substances, initial encounter: Secondary | ICD-10-CM

## 2021-12-20 MED ORDER — ONDANSETRON 4 MG PO TBDP
4.0000 mg | ORAL_TABLET | Freq: Once | ORAL | Status: AC
  Administered 2021-12-20: 4 mg via ORAL

## 2021-12-20 MED ORDER — DOXYCYCLINE HYCLATE 100 MG PO CAPS: 100.0000 mg | ORAL_CAPSULE | Freq: Two times a day (BID) | ORAL | 0 refills | Status: AC

## 2021-12-20 MED ORDER — ONDANSETRON 4 MG PO TBDP: 4.0000 mg | ORAL_TABLET | Freq: Three times a day (TID) | ORAL | 0 refills | Status: DC | PRN

## 2021-12-20 NOTE — ED Triage Notes (Signed)
Pt presents with nausea, vomiting, and diarrhea since being put on amoxicillin for cat bite X 2 days ago.

## 2021-12-20 NOTE — Discharge Instructions (Addendum)
-  Take the Zofran (ondansetron) up to 3 times daily for nausea and vomiting. -Stop augmentin and start doxycycline -Doxycycline twice daily for 7 days.  Make sure to wear sunscreen while spending time outside while on this medication as it can increase your chance of sunburn. You can take this medication with food if you have a sensitive stomach. -Drink plenty of fluids and eat a bland diet as tolerated -Bring back your stool kit at your earliest convenience. -Continue probiotic  -Seek additional care if symptoms persist despite treatment - you can't keep fluids down, you develop severe smelly or red diarrhea, etc.

## 2021-12-20 NOTE — ED Provider Notes (Signed)
[name]    CSN: [id number] Arrival date & time: 12/20/21  1014      History   Chief Complaint Chief Complaint  Patient presents with   Nausea   Emesis   Diarrhea    HPI Megan Stokes is a 71 y.o. female presenting with n/v/d/abd pain on augmentin x2 days following cat bite. History diverticular disease. Last abx prior to this was >5 years ago per pt. Tdap UTD 5 years ago.  Has completed 4 doses of the Augmentin.  Describes profuse diarrhea that has actually improved on probiotic.  She is now experiencing nausea with intractable vomiting, last vomit about 3 hours ago.  Tolerating clear fluids but nothing else.  Crampy lower abdominal pain that waxes and wanes.  Diarrhea is not malodorous, mucousy, bloody.  Denies hematemesis.  Denies urinary symptoms.  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      Home Medications    Prior to Admission medications   Medication Sig Start Date End Date Taking? Authorizing Provider  doxycycline (VIBRAMYCIN) 100 MG capsule Take 1 capsule (100 mg total) by mouth 2 (two)  times daily for 7 days. 12/20/21 12/27/21 Yes Hazel Sams, PA-C  ondansetron (ZOFRAN-ODT) 4 MG disintegrating tablet Take 1 tablet (4 mg total) by mouth every 8 (eight) hours as needed for nausea or vomiting. 12/20/21  Yes Hazel Sams, PA-C  alendronate (FOSAMAX) 70 MG tablet Take 1 tablet (70 mg total) by mouth once a week. 09/28/21   Lesleigh Noe, MD  levothyroxine (SYNTHROID, LEVOTHROID) 75 MCG tablet Take 75 mcg by mouth daily before breakfast.    [provider]  lisinopril (ZESTRIL) 20 MG tablet Take 1 tablet (20 mg total) by mouth daily. 11/02/21   Lesleigh Noe, MD  lovastatin (MEVACOR) 40 MG tablet TAKE 1 TABLET BY MOUTH ONCE DAILY 10/05/21   Lesleigh Noe, MD  meloxicam (MOBIC) 7.5 MG tablet Take 1 tablet (7.5 mg total) by mouth daily. 10/25/21   Lesleigh Noe, MD    Family History Family History  Problem Relation Age of Onset   Stroke Mother     Social History Social History   Tobacco Use   Smoking status: Former   Smokeless tobacco: Never  Scientific laboratory technician Use: Never used  Substance Use Topics   Alcohol use: Yes    Alcohol/week: 2.0 - 3.0 standard drinks of alcohol    Types: 2 - 3 Glasses of wine per week    Comment: 2 glasses nightly  Drug use: No     Allergies   Augmentin [amoxicillin-pot clavulanate] and Codeine   Review of Systems Review of Systems  Constitutional:  Negative for appetite change, chills, diaphoresis, fever and unexpected weight change.  HENT:  Negative for congestion, ear pain, sinus pressure, sinus pain, sneezing, sore throat and trouble swallowing.   Respiratory:  Negative for cough, chest tightness and shortness of breath.   Cardiovascular:  Negative for chest pain.  Gastrointestinal:  Positive for abdominal pain, diarrhea, nausea and vomiting. Negative for abdominal distention, anal bleeding, blood in stool, constipation and rectal pain.  Genitourinary:  Negative for dysuria, flank pain, frequency and urgency.   Musculoskeletal:  Negative for back pain and myalgias.  Neurological:  Negative for dizziness, light-headedness and headaches.  All other systems reviewed and are negative.    Physical Exam Triage Vital Signs ED Triage Vitals  Enc Vitals Group     BP 12/20/21 1029 119/75     Pulse Rate 12/20/21 1029 87     Resp 12/20/21 1029 16     Temp 12/20/21 1029 98.7 F (37.1 C)     Temp Source 12/20/21 1029 Oral     SpO2 12/20/21 1029 95 %     Weight --      Height --      Head Circumference --      Peak Flow --      Pain Score 12/20/21 1028 2     Pain Loc --      Pain Edu? --      Excl. in [place]? --    No data found.  Updated Vital Signs BP 119/75 (BP Location: Left Arm)   Pulse 87   Temp 98.7 F (37.1 C) (Oral)   Resp 16   SpO2 95%   Visual Acuity Right Eye Distance:   Left Eye Distance:   Bilateral Distance:    Right Eye Near:   Left Eye Near:    Bilateral Near:     Physical Exam Vitals reviewed.  Constitutional:      General: She is not in acute distress.    Appearance: Normal appearance. She is not ill-appearing.  HENT:     Head: Normocephalic and atraumatic.     Mouth/Throat:     Mouth: Mucous membranes are moist.     Comments: Moist mucous membranes Eyes:     Extraocular Movements: Extraocular movements intact.     Pupils: Pupils are equal, round, and reactive to light.  Cardiovascular:     Rate and Rhythm: Normal rate and regular rhythm.     Heart sounds: Normal heart sounds.  Pulmonary:     Effort: Pulmonary effort is normal.     Breath sounds: Normal breath sounds. No wheezing, rhonchi or rales.  Abdominal:     General: Bowel sounds are increased. There is no distension.     Palpations: Abdomen is soft. There is no mass.     Tenderness: There is abdominal tenderness. There is no right CVA tenderness, left CVA tenderness, guarding or rebound. Negative signs include Murphy's sign, Rovsing's sign and McBurney's sign.     Comments: Minimal lower abd pain  to palpation. No guarding or rebound. Comfortable throughout exam.   Skin:    General: Skin is warm.     Capillary Refill: Capillary refill takes 2 to 3 seconds.     Comments: See image below Several small puncture wounds L calf. One 2cm abrasion lateral knee. No induration, fluctuance, warmth, drainage.   Neurological:  General: No focal deficit present.     Mental Status: She is alert and oriented to person, place, and time.  Psychiatric:        Mood and Affect: Mood normal.        Behavior: Behavior normal.        Thought Content: Thought content normal.        Judgment: Judgment normal.     L LE   UC Treatments / Results  Labs (all labs ordered are listed, but only abnormal results are displayed) Labs Reviewed - No data to display  EKG   Radiology No results found.  Procedures Procedures (including critical care time)  Medications Ordered in UC Medications  ondansetron (ZOFRAN-ODT) disintegrating tablet 4 mg (has no administration in time range)    Initial Impression / Assessment and Plan / UC Course  I have reviewed the triage vital signs and the nursing notes.  Pertinent labs & imaging results that were available during my care of the patient were reviewed by me and considered in my medical decision making (see chart for details).     This patient is a very pleasant 71 y.o. year old female presenting with n/v/d on augmentin x2 days following cat bite. Afebrile, nontachy. Allergy list updated. She is not truly allergic - no swelling, hives, rashes - but she is intolerant to augmentin due to n/v/d. The cat bite appears to be healing well (see image above). Tdap UTD <5 years.   Zofran odt administered during visit, and sent to pharmacy. She completed 4 doses of augmentin. Stop augmentin and start doxycycline.   Diarrhea is improving with probiotic. History diverticulitis; there is minimal abd pain on exam today. Last abx prior to the augmentin was 5 years ago  per pt, so low concern for c dif. Following discussion will proceed with stool kit, which she can bring back at her convenience.   ED return precautions discussed. Patient verbalizes understanding and agreement.    Final Clinical Impressions(s) / UC Diagnoses   Final diagnoses:  Nausea vomiting and diarrhea  Adverse effect of drug, initial encounter     Discharge Instructions      -Take the Zofran (ondansetron) up to 3 times daily for nausea and vomiting. -Stop augmentin and start doxycycline -Doxycycline twice daily for 7 days.  Make sure to wear sunscreen while spending time outside while on this medication as it can increase your chance of sunburn. You can take this medication with food if you have a sensitive stomach. -Drink plenty of fluids and eat a bland diet as tolerated -Bring back your stool kit at your earliest convenience. -Continue probiotic  -Seek additional care if symptoms persist despite treatment - you can't keep fluids down, you develop severe smelly or red diarrhea, etc.    ED Prescriptions     Medication Sig Dispense Auth. Provider   doxycycline (VIBRAMYCIN) 100 MG capsule Take 1 capsule (100 mg total) by mouth 2 (two) times daily for 7 days. 14 capsule Marin Roberts E, PA-C   ondansetron (ZOFRAN-ODT) 4 MG disintegrating tablet Take 1 tablet (4 mg total) by mouth every 8 (eight) hours as needed for nausea or vomiting. 21 tablet Hazel Sams, PA-C      PDMP not reviewed this encounter.   Hazel Sams, PA-C 12/20/21 1109

## 2022-01-16 ENCOUNTER — Ambulatory Visit (INDEPENDENT_AMBULATORY_CARE_PROVIDER_SITE_OTHER): Payer: Medicare PPO | Admitting: *Deleted

## 2022-01-16 DIAGNOSIS — Z0000 Encounter for general adult medical examination without abnormal findings: Secondary | ICD-10-CM | POA: Diagnosis not present

## 2022-01-16 NOTE — Progress Notes (Signed)
Subjective:   Megan Stokes is a 71 y.o. female who presents for Medicare Annual (Subsequent) preventive examination.  I connected with  Megan Stokes on 01/16/22 by a telephone enabled telemedicine application and verified that I am speaking with the correct person using two identifiers.   I discussed the limitations of evaluation and management by telemedicine. The patient expressed understanding and agreed to proceed.  Patient location: home  Provider location: Tele-Health- home    Review of Systems      Cardiac Risk Factors include: advanced age (>2mn, >>35women)     Objective:    Today's Vitals   01/16/22 1102  PainSc: 5    There is no height or weight on file to calculate BMI.     01/16/2022   11:04 AM 02/12/2014    8:42 PM 02/05/2014    3:24 PM  Advanced Directives  Does Patient Have a Medical Advance Directive? Yes Yes Yes  Type of Advance Directive Living will Living will Living will  Does patient want to make changes to medical advance directive?  No - Patient declined   Copy of [place] Chart?  No - copy requested No - copy requested    Current Medications (verified) Outpatient Encounter Medications as of 01/16/2022  Medication Sig   alendronate (FOSAMAX) 70 MG tablet Take 1 tablet (70 mg total) by mouth once a week.   levothyroxine (SYNTHROID, LEVOTHROID) 75 MCG tablet Take 75 mcg by mouth daily before breakfast.   lisinopril (ZESTRIL) 20 MG tablet Take 1 tablet (20 mg total) by mouth daily.   lovastatin (MEVACOR) 40 MG tablet TAKE 1 TABLET BY MOUTH ONCE DAILY   meloxicam (MOBIC) 7.5 MG tablet Take 1 tablet (7.5 mg total) by mouth daily.   ondansetron (ZOFRAN-ODT) 4 MG disintegrating tablet Take 1 tablet (4 mg total) by mouth every 8 (eight) hours as needed for nausea or vomiting.   No facility-administered encounter medications on file as of 01/16/2022.    Allergies (verified) Augmentin [amoxicillin-pot clavulanate] and  Codeine   History: Past Medical History:  Diagnosis Date   Hypothyroidism    PONV (postoperative nausea and vomiting)    Past Surgical History:  Procedure Laterality Date   BREAST BIOPSY Left    CESAREAN SECTION     X 2   HEMORROIDECTOMY     SHOULDER SURGERY Left    Pt had limited movement with left shoulder/arm   THYROID CYST EXCISION     Family History  Problem Relation Age of Onset   Stroke Mother    Social History   Socioeconomic History   Marital status: Divorced    Spouse name: Not on file   Number of children: 3   Years of education: high school   Highest education level: Not on file  Occupational History   Not on file  Tobacco Use   Smoking status: Former   Smokeless tobacco: Never  Vaping Use   Vaping Use: Never used  Substance and Sexual Activity   Alcohol use: Yes    Alcohol/week: 2.0 - 3.0 standard drinks of alcohol    Types: 2 - 3 Glasses of wine per week    Comment: 2 glasses nightly   Drug use: No   Sexual activity: Not Currently  Other Topics Concern   Not on file  Social History Narrative   09/21/21   From: the area   Living: alone   Work: retired - UEngineer, agricultural aEnvironmental consultant  Family: 3 sons - Megan Stokes, Megan Stokes, Megan Stokes - no grandchildren      Enjoys: work in the yard, cooking, walking      Exercise: walking 5 miles daily   Diet: pretty good      Land belts: Yes    Guns: No   Safe in relationships: Yes       Social Determinants of Radio broadcast assistant Strain: Low Risk  (01/16/2022)   Overall Financial Resource Strain (CARDIA)    Difficulty of Paying Living Expenses: Not hard at all  Food Insecurity: Not on file  Transportation Needs: No Transportation Needs (01/16/2022)   PRAPARE - Hydrologist (Medical): No    Lack of Transportation (Non-Medical): No  Physical Activity: Sufficiently Active (01/16/2022)   Exercise Vital Sign    Days of Exercise per Week: 5 days    Minutes of Exercise per Session:  40 min  Stress: No Stress Concern Present (01/16/2022)   Megan Stokes    Feeling of Stress : Not at all  Social Connections: Moderately Isolated (01/16/2022)   Social Connection and Isolation Panel [NHANES]    Frequency of Communication with Friends and Family: More than three times a week    Frequency of Social Gatherings with Friends and Family: More than three times a week    Attends Religious Services: More than 4 times per year    Active Member of Megan Stokes or Organizations: No    Attends Music therapist: Never    Marital Status: Divorced    Tobacco Counseling Counseling given: Not Answered   Clinical Intake:  Pre-visit preparation completed: Yes  Pain : 0-10 Pain Score: 5  Pain Type: Chronic pain Pain Location: Back Pain Descriptors / Indicators: Constant, Burning Pain Onset: More than a month ago Pain Frequency: Constant Pain Relieving Factors: nothing  Pain Relieving Factors: nothing  Nutritional Risks: None Diabetes: No  How often do you need to have someone help you when you read instructions, pamphlets, or other written materials from your doctor or pharmacy?: 1 - Never  Diabetic?  no  Interpreter Needed?: No  Information entered by :: Leroy Kennedy LPN   Activities of Daily Living    01/16/2022   11:10 AM  In your present state of health, do you have any difficulty performing the following activities:  Hearing? 0  Vision? 0  Difficulty concentrating or making decisions? 0  Walking or climbing stairs? 0  Dressing or bathing? 0  Doing errands, shopping? 0  Preparing Food and eating ? N  Using the Toilet? N  In the past six months, have you accidently leaked urine? Y  Do you have problems with loss of bowel control? N  Managing your Medications? N  Managing your Finances? N  Housekeeping or managing your Housekeeping? N    Patient Care Team: Lesleigh Noe, MD as PCP -  General (Family Medicine) Leonard Downing, MD (Family Medicine)  Indicate any recent Medical Services you may have received from other than [facility] providers in the past year (date may be approximate).     Assessment:   This is a routine wellness examination for Megan Stokes.  Hearing/Vision screen Hearing Screening - Comments:: No trouble hearing Vision Screening - Comments:: Not up to date  Dietary issues and exercise activities discussed: Current Exercise Habits: Home exercise routine, Type of exercise: walking, Time (Minutes): 40, Frequency (Times/Week): 5, Weekly Exercise (Minutes/Week): 200,  Intensity: Mild   Goals Addressed             This Visit's Progress    Patient Stated       Stay health       Depression Screen    01/16/2022   11:09 AM  PHQ 2/9 Scores  PHQ - 2 Score 0  PHQ- 9 Score 2    Fall Risk    01/16/2022   11:05 AM  Fall Risk   Falls in the past year? 0  Number falls in past yr: 0  Injury with Fall? 0  Follow up Falls evaluation completed;Education provided;Falls prevention discussed    FALL RISK PREVENTION PERTAINING TO THE HOME:  Any stairs in or around the home? No  If so, are there any without handrails? No  Home free of loose throw rugs in walkways, pet beds, electrical cords, etc? Yes  Adequate lighting in your home to reduce risk of falls? Yes   ASSISTIVE DEVICES UTILIZED TO PREVENT FALLS:  Life alert? No  Use of a cane, walker or w/c? No  Grab bars in the bathroom? Yes  Shower chair or bench in shower? No  Elevated toilet seat or a handicapped toilet? Yes   TIMED UP AND GO:  Was the test performed? No .    Cognitive Function:        01/16/2022   11:06 AM  6CIT Screen  What Year? 0 points  What month? 0 points  What time? 0 points  Count back from 20 0 points  Months in reverse 0 points  Repeat phrase 0 points  Total Score 0 points    Immunizations Immunization History  Administered Date(s) Administered    Influenza,inj,quad, With Preservative 04/03/2019   PFIZER Comirnaty(Gray Top)Covid-19 Tri-Sucrose Vaccine 08/02/2019, 08/27/2019, 04/12/2020, 01/12/2021   Pneumococcal Polysaccharide-23 04/02/2019   Zoster Recombinat (Shingrix) 04/02/2019, 07/10/2019    TDAP status: Due, Education has been provided regarding the importance of this vaccine. Advised may receive this vaccine at local pharmacy or Health Dept. Aware to provide a copy of the vaccination record if obtained from local pharmacy or Health Dept. Verbalized acceptance and understanding.  Flu Vaccine status: Due, Education has been provided regarding the importance of this vaccine. Advised may receive this vaccine at local pharmacy or Health Dept. Aware to provide a copy of the vaccination record if obtained from local pharmacy or Health Dept. Verbalized acceptance and understanding.  Pneumococcal vaccine status: Due, Education has been provided regarding the importance of this vaccine. Advised may receive this vaccine at local pharmacy or Health Dept. Aware to provide a copy of the vaccination record if obtained from local pharmacy or Health Dept. Verbalized acceptance and understanding.  Covid-19 vaccine status: Information provided on how to obtain vaccines.   Qualifies for Shingles Vaccine? No   Zostavax completed No   Shingrix Completed?: Yes  Screening Tests Health Maintenance  Topic Date Due   Hepatitis C Screening  Never done   Pneumonia Vaccine 57+ Years old (2 - PCV) 04/01/2020   COVID-19 Vaccine (5 - Pfizer series) 02/01/2022 (Originally 03/09/2021)   COLONOSCOPY (Pts 45-24yr Insurance coverage will need to be confirmed)  01/17/2023 (Originally 10/27/2021)   TETANUS/TDAP  01/17/2023 (Originally 12/23/1969)   INFLUENZA VACCINE  01/17/2022   MAMMOGRAM  07/06/2023   DEXA SCAN  Completed   Zoster Vaccines- Shingrix  Completed   HPV VACCINES  Aged Out    Health Maintenance  Health Maintenance Due  Topic Date Due  Hepatitis C Screening  Never done   Pneumonia Vaccine 67+ Years old (2 - PCV) 04/01/2020  Colonoscopy 2020   no longer required written on pathology report    Mammogram status: Ordered  . Pt provided with contact info and advised to call to schedule appt.   Bone Density status: Ordered  . Pt provided with contact info and advised to call to schedule appt.  Lung Cancer Screening: (Low Dose CT Chest recommended if Age 92-80 years, 30 pack-year currently smoking OR have quit w/in 15years.) does not qualify.   Lung Cancer Screening Referral:   Additional Screening:  Hepatitis C Screening: does qualify;  Vision Screening: Recommended annual ophthalmology exams for early detection of glaucoma and other disorders of the eye. Is the patient up to date with their annual eye exam?  No  Who is the provider or what is the name of the office in which the patient attends annual eye exams?  If pt is not established with a provider, would they like to be referred to a provider to establish care? No .   Dental Screening: Recommended annual dental exams for proper oral hygiene  Community Resource Referral / Chronic Care Management: CRR required this visit?  No   CCM required this visit?  No      Plan:     I have personally reviewed and noted the following in the patient's chart:   Medical and social history Use of alcohol, tobacco or illicit drugs  Current medications and supplements including opioid prescriptions.  Functional ability and status Nutritional status Physical activity Advanced directives List of other physicians Hospitalizations, surgeries, and ER visits in previous 12 months Vitals Screenings to include cognitive, depression, and falls Referrals and appointments  In addition, I have reviewed and discussed with patient certain preventive protocols, quality metrics, and best practice recommendations. A written personalized care plan for preventive services as well as  general preventive health recommendations were provided to patient.     Leroy Kennedy, LPN   [id number]/86/[id number]   Nurse Notes:

## 2022-01-16 NOTE — Patient Instructions (Signed)
[name] , Thank you for taking time to come for your Medicare Wellness Visit. I appreciate your ongoing commitment to your health goals. Please review the following plan we discussed and let me know if I can assist you in the future.   Screening recommendations/referrals: Colonoscopy: up to date Mammogram: ordered due in 03-2022 Bone Density: ordered due in 03-2022 Recommended yearly ophthalmology/optometry visit for glaucoma screening and checkup Recommended yearly dental visit for hygiene and checkup  Vaccinations: Influenza vaccine: Education provided Pneumococcal vaccine: Education provided Tdap vaccine: Education provided Shingles vaccine: up to date    Advanced directives: living will not on file  Conditions/risks identified:   Next appointment: 02-14-2022 @ 11:20  [place] 73 Years and Older, Female Preventive care refers to lifestyle choices and visits with your health care provider that can promote health and wellness. What does preventive care include? A yearly physical exam. This is also called an annual well check. Dental exams once or twice a year. Routine eye exams. Ask your health care provider how often you should have your eyes checked. Personal lifestyle choices, including: Daily care of your teeth and gums. Regular physical activity. Eating a healthy diet. Avoiding tobacco and drug use. Limiting alcohol use. Practicing safe sex. Taking low-dose aspirin every day. Taking vitamin and mineral supplements as recommended by your health care provider. What happens during an annual well check? The services and screenings done by your health care provider during your annual well check will depend on your age, overall health, lifestyle risk factors, and family history of disease. Counseling  Your health care provider may ask you questions about your: Alcohol use. Tobacco use. Drug use. Emotional well-being. Home and relationship  well-being. Sexual activity. Eating habits. History of falls. Memory and ability to understand (cognition). Work and work Statistician. Reproductive health. Screening  You may have the following tests or measurements: Height, weight, and BMI. Blood pressure. Lipid and cholesterol levels. These may be checked every 5 years, or more frequently if you are over 54 years old. Skin check. Lung cancer screening. You may have this screening every year starting at age 56 if you have a 30-pack-year history of smoking and currently smoke or have quit within the past 15 years. Fecal occult blood test (FOBT) of the stool. You may have this test every year starting at age 58. Flexible sigmoidoscopy or colonoscopy. You may have a sigmoidoscopy every 5 years or a colonoscopy every 10 years starting at age 61. Hepatitis C blood test. Hepatitis B blood test. Sexually transmitted disease (STD) testing. Diabetes screening. This is done by checking your blood sugar (glucose) after you have not eaten for a while (fasting). You may have this done every 1-3 years. Bone density scan. This is done to screen for osteoporosis. You may have this done starting at age 93. Mammogram. This may be done every 1-2 years. Talk to your health care provider about how often you should have regular mammograms. Talk with your health care provider about your test results, treatment options, and if necessary, the need for more tests. Vaccines  Your health care provider may recommend certain vaccines, such as: Influenza vaccine. This is recommended every year. Tetanus, diphtheria, and acellular pertussis (Tdap, Td) vaccine. You may need a Td booster every 10 years. Zoster vaccine. You may need this after age 71. Pneumococcal 13-valent conjugate (PCV13) vaccine. One dose is recommended after age 28. Pneumococcal polysaccharide (PPSV23) vaccine. One dose is recommended after age 87. Talk to  your health care provider about which  screenings and vaccines you need and how often you need them. This information is not intended to replace advice given to you by your health care provider. Make sure you discuss any questions you have with your health care provider. Document Released: 07/02/2015 Document Revised: 02/23/2016 Document Reviewed: 04/06/2015 Elsevier Interactive Patient Education  2017 [place] Prevention in the Home Falls can cause injuries. They can happen to people of all ages. There are many things you can do to make your home safe and to help prevent falls. What can I do on the outside of my home? Regularly fix the edges of walkways and driveways and fix any cracks. Remove anything that might make you trip as you walk through a door, such as a raised step or threshold. Trim any bushes or trees on the path to your home. Use bright outdoor lighting. Clear any walking paths of anything that might make someone trip, such as rocks or tools. Regularly check to see if handrails are loose or broken. Make sure that both sides of any steps have handrails. Any raised decks and porches should have guardrails on the edges. Have any leaves, snow, or ice cleared regularly. Use sand or salt on walking paths during winter. Clean up any spills in your garage right away. This includes oil or grease spills. What can I do in the bathroom? Use night lights. Install grab bars by the toilet and in the tub and shower. Do not use towel bars as grab bars. Use non-skid mats or decals in the tub or shower. If you need to sit down in the shower, use a plastic, non-slip stool. Keep the floor dry. Clean up any water that spills on the floor as soon as it happens. Remove soap buildup in the tub or shower regularly. Attach bath mats securely with double-sided non-slip rug tape. Do not have throw rugs and other things on the floor that can make you trip. What can I do in the bedroom? Use night lights. Make sure that you have a  light by your bed that is easy to reach. Do not use any sheets or blankets that are too big for your bed. They should not hang down onto the floor. Have a firm chair that has side arms. You can use this for support while you get dressed. Do not have throw rugs and other things on the floor that can make you trip. What can I do in the kitchen? Clean up any spills right away. Avoid walking on wet floors. Keep items that you use a lot in easy-to-reach places. If you need to reach something above you, use a strong step stool that has a grab bar. Keep electrical cords out of the way. Do not use floor polish or wax that makes floors slippery. If you must use wax, use non-skid floor wax. Do not have throw rugs and other things on the floor that can make you trip. What can I do with my stairs? Do not leave any items on the stairs. Make sure that there are handrails on both sides of the stairs and use them. Fix handrails that are broken or loose. Make sure that handrails are as long as the stairways. Check any carpeting to make sure that it is firmly attached to the stairs. Fix any carpet that is loose or worn. Avoid having throw rugs at the top or bottom of the stairs. If you do have throw rugs, attach  them to the floor with carpet tape. Make sure that you have a light switch at the top of the stairs and the bottom of the stairs. If you do not have them, ask someone to add them for you. What else can I do to help prevent falls? Wear shoes that: Do not have high heels. Have rubber bottoms. Are comfortable and fit you well. Are closed at the toe. Do not wear sandals. If you use a stepladder: Make sure that it is fully opened. Do not climb a closed stepladder. Make sure that both sides of the stepladder are locked into place. Ask someone to hold it for you, if possible. Clearly mark and make sure that you can see: Any grab bars or handrails. First and last steps. Where the edge of each step  is. Use tools that help you move around (mobility aids) if they are needed. These include: Canes. Walkers. Scooters. Crutches. Turn on the lights when you go into a dark area. Replace any light bulbs as soon as they burn out. Set up your furniture so you have a clear path. Avoid moving your furniture around. If any of your floors are uneven, fix them. If there are any pets around you, be aware of where they are. Review your medicines with your doctor. Some medicines can make you feel dizzy. This can increase your chance of falling. Ask your doctor what other things that you can do to help prevent falls. This information is not intended to replace advice given to you by your health care provider. Make sure you discuss any questions you have with your health care provider. Document Released: 04/01/2009 Document Revised: 11/11/2015 Document Reviewed: 07/10/2014 Elsevier Interactive Patient Education  2017 [employer].

## 2022-02-14 ENCOUNTER — Ambulatory Visit (INDEPENDENT_AMBULATORY_CARE_PROVIDER_SITE_OTHER): Payer: Medicare PPO | Admitting: Family Medicine

## 2022-02-14 VITALS — BP 138/62 | HR 54 | Temp 97.5°F | Ht 60.75 in | Wt 107.2 lb

## 2022-02-14 DIAGNOSIS — Z0000 Encounter for general adult medical examination without abnormal findings: Secondary | ICD-10-CM

## 2022-02-14 DIAGNOSIS — I10 Essential (primary) hypertension: Secondary | ICD-10-CM

## 2022-02-14 DIAGNOSIS — E039 Hypothyroidism, unspecified: Secondary | ICD-10-CM | POA: Diagnosis not present

## 2022-02-14 DIAGNOSIS — M810 Age-related osteoporosis without current pathological fracture: Secondary | ICD-10-CM

## 2022-02-14 DIAGNOSIS — Z13220 Encounter for screening for lipoid disorders: Secondary | ICD-10-CM

## 2022-02-14 LAB — COMPREHENSIVE METABOLIC PANEL
ALT: 10 U/L (ref 0–35)
AST: 16 U/L (ref 0–37)
Albumin: 4.5 g/dL (ref 3.5–5.2)
Alkaline Phosphatase: 61 U/L (ref 39–117)
BUN: 16 mg/dL (ref 6–23)
CO2: 27 mEq/L (ref 19–32)
Calcium: 9.3 mg/dL (ref 8.4–10.5)
Chloride: 98 mEq/L (ref 96–112)
Creatinine, Ser: 0.63 mg/dL (ref 0.40–1.20)
GFR: 88.17 mL/min (ref >60.00)
Glucose, Bld: 84 mg/dL (ref 70–99)
Potassium: 4.5 mEq/L (ref 3.5–5.1)
Sodium: 134 mEq/L — ABNORMAL LOW (ref 135–145)
Total Bilirubin: 0.8 mg/dL (ref 0.2–1.2)
Total Protein: 7.4 g/dL (ref 6.0–8.3)

## 2022-02-14 LAB — LIPID PANEL
Cholesterol: 171 mg/dL (ref 0–200)
HDL: 82.2 mg/dL (ref >39.00)
LDL Cholesterol: 80 mg/dL (ref 0–99)
NonHDL: 88.88
Total CHOL/HDL Ratio: 2
Triglycerides: 46 mg/dL (ref 0.0–149.0)
VLDL: 9.2 mg/dL (ref 0.0–40.0)

## 2022-02-14 LAB — CBC
HCT: 34.8 % — ABNORMAL LOW (ref 36.0–46.0)
Hemoglobin: 11.8 g/dL — ABNORMAL LOW (ref 12.0–15.0)
MCHC: 34.1 g/dL (ref 30.0–36.0)
MCV: 101.5 fl — ABNORMAL HIGH (ref 78.0–100.0)
Platelets: 287 10*3/uL (ref 150.0–400.0)
RBC: 3.43 Mil/uL — ABNORMAL LOW (ref 3.87–5.11)
RDW: 12.6 % (ref 11.5–15.5)
WBC: 3.9 10*3/uL — ABNORMAL LOW (ref 4.0–10.5)

## 2022-02-14 LAB — TSH: TSH: 0.9 u[IU]/mL (ref 0.35–5.50)

## 2022-02-14 LAB — VITAMIN D 25 HYDROXY (VIT D DEFICIENCY, FRACTURES): VITD: 42 ng/mL (ref 30.00–100.00)

## 2022-02-14 NOTE — Progress Notes (Signed)
Annual Exam   Chief Complaint:  Chief Complaint  Patient presents with   Medicare Wellness    Pt 2    History of Present Illness:  [name] is a 71 y.o. No obstetric history on file. who LMP was No LMP recorded. Patient is postmenopausal., presents today for her annual examination.    Has a urogyn appointment next week  Nutrition She does get adequate calcium and Vitamin D in her diet. Diet: healthy Exercise: walking daily     Social History   Tobacco Use  Smoking Status Former  Smokeless Tobacco Never   Social History   Substance and Sexual Activity  Alcohol Use Yes   Alcohol/week: 2.0 - 3.0 standard drinks of alcohol   Types: 2 - 3 Glasses of wine per week   Comment: 2 glasses nightly   Social History   Substance and Sexual Activity  Drug Use No     General Health Dentist in the last year: No Eye doctor: no  Safety The patient wears seatbelts: yes.     The patient feels safe at home and in their relationships: yes.   Menstrual:  Symptoms of menopause: none  GYN She is not sexually active.   Breast Cancer Screening (Age 7-74):  There is no FH of breast cancer. There is no FH of ovarian cancer. BRCA screening Not Indicated.  Last Mammogram: 06/2021 The patient does want a mammogram this year.    Colon Cancer Screening:  Age 27-75 yo - benefits outweigh the risk. Adults 67-85 yo who have never been screened benefit.  Benefits: 134000 people in 2016 will be diagnosed and 49,000 will die - early detection helps [place]: Complications 2/2 to colonoscopy High Risk (Colonoscopy): genetic disorder (Lynch syndrome or familial adenomatous polyposis), personal hx of IBD, previous adenomatous polyp, or previous colorectal cancer, FamHx start 10 years before the age at diagnosis, increased in males and black race  Options:  FIT - looks for hemoglobin (blood in the stool) - specific and fairly sensitive - must be done annually Cologuard - looks for  DNA and blood - more sensitive - therefore can have more false positives, every 3 years Colonoscopy - every 10 years if normal - sedation, bowl prep, must have someone drive you  Shared decision making and the patient had decided to do colonoscopy in 2020 no longer needed.   Social History   Tobacco Use  Smoking Status Former  Smokeless Tobacco Never    Lung Cancer Screening (Ages 62-94): not applicable   Weight Wt Readings from Last 3 Encounters:  02/14/22 107 lb 4 oz (48.6 kg)  10/25/21 109 lb 5 oz (49.6 kg)  09/21/21 112 lb (50.8 kg)   Patient has normal BMI  BMI Readings from Last 1 Encounters:  02/14/22 20.43 kg/m     Chronic disease screening Blood pressure monitoring:  BP Readings from Last 3 Encounters:  02/14/22 138/62  12/20/21 119/75  12/18/21 (!) 179/76    Lipid Monitoring: Indication for screening: age >3, obesity, diabetes, family hx, CV risk factors.  Lipid screening: Yes  No results found for: "CHOL", "HDL", "LDLCALC", "LDLDIRECT", "TRIG", "CHOLHDL"   Diabetes Screening: age >26, overweight, family hx, PCOS, hx of gestational diabetes, at risk ethnicity Diabetes Screening screening: Yes  No results found for: "HGBA1C"   Past Medical History:  Diagnosis Date   Hypothyroidism    PONV (postoperative nausea and vomiting)     Past Surgical History:  Procedure Laterality Date   BREAST  BIOPSY Left    CESAREAN SECTION     X 2   HEMORROIDECTOMY     SHOULDER SURGERY Left    Pt had limited movement with left shoulder/arm   THYROID CYST EXCISION      Prior to Admission medications   Medication Sig Start Date End Date Taking? Authorizing Provider  alendronate (FOSAMAX) 70 MG tablet Take 1 tablet (70 mg total) by mouth once a week. 09/28/21  Yes Lesleigh Noe, MD  levothyroxine (SYNTHROID, LEVOTHROID) 75 MCG tablet Take 75 mcg by mouth daily before breakfast.   Yes [provider]  lisinopril (ZESTRIL) 20 MG tablet Take 1 tablet (20  mg total) by mouth daily. 11/02/21  Yes Lesleigh Noe, MD  lovastatin (MEVACOR) 40 MG tablet TAKE 1 TABLET BY MOUTH ONCE DAILY 10/05/21  Yes Lesleigh Noe, MD  meloxicam (MOBIC) 7.5 MG tablet Take 1 tablet (7.5 mg total) by mouth daily. 10/25/21  Yes Lesleigh Noe, MD  ondansetron (ZOFRAN-ODT) 4 MG disintegrating tablet Take 1 tablet (4 mg total) by mouth every 8 (eight) hours as needed for nausea or vomiting. 12/20/21  Yes Hazel Sams, PA-C    Allergies  Allergen Reactions   Augmentin [Amoxicillin-Pot Clavulanate] Nausea And Vomiting    No hives or swelling. Profuse diarrhea, nausea, vomiting.    Codeine Nausea And Vomiting    Gynecologic History: No LMP recorded. Patient is postmenopausal.  Obstetric History: No obstetric history on file.  Social History   Socioeconomic History   Marital status: Divorced    Spouse name: Not on file   Number of children: 3   Years of education: high school   Highest education level: Not on file  Occupational History   Not on file  Tobacco Use   Smoking status: Former   Smokeless tobacco: Never  Vaping Use   Vaping Use: Never used  Substance and Sexual Activity   Alcohol use: Yes    Alcohol/week: 2.0 - 3.0 standard drinks of alcohol    Types: 2 - 3 Glasses of wine per week    Comment: 2 glasses nightly   Drug use: No   Sexual activity: Not Currently  Other Topics Concern   Not on file  Social History Narrative   09/21/21   From: the area   Living: alone   Work: retired - Engineer, agricultural, Environmental consultant      Family: 3 sons - Theresia Lo, Balfour, Olen Cordial - no grandchildren      Enjoys: work in the yard, cooking, walking      Exercise: walking 5 miles daily   Diet: pretty good      Land belts: Yes    Guns: No   Safe in relationships: Yes       Social Determinants of Radio broadcast assistant Strain: Low Risk  (01/16/2022)   Overall Financial Resource Strain (CARDIA)    Difficulty of Paying Living Expenses: Not hard at all  Food  Insecurity: Not on file  Transportation Needs: No Transportation Needs (01/16/2022)   PRAPARE - Hydrologist (Medical): No    Lack of Transportation (Non-Medical): No  Physical Activity: Sufficiently Active (01/16/2022)   Exercise Vital Sign    Days of Exercise per Week: 5 days    Minutes of Exercise per Session: 40 min  Stress: No Stress Concern Present (01/16/2022)   [place]    Feeling of Stress : Not  at all  Social Connections: Moderately Isolated (01/16/2022)   Social Connection and Isolation Panel [NHANES]    Frequency of Communication with Friends and Family: More than three times a week    Frequency of Social Gatherings with Friends and Family: More than three times a week    Attends Religious Services: More than 4 times per year    Active Member of [employer] or Organizations: No    Attends Archivist Meetings: Never    Marital Status: Divorced  Human resources officer Violence: Not At Risk (01/16/2022)   Humiliation, Afraid, Rape, and Kick questionnaire    Fear of Current or Ex-Partner: No    Emotionally Abused: No    Physically Abused: No    Sexually Abused: No    Family History  Problem Relation Age of Onset   Stroke Mother     Review of Systems  Constitutional:  Negative for chills and fever.  HENT:  Negative for congestion and sore throat.   Eyes:  Negative for blurred vision and double vision.  Respiratory:  Negative for shortness of breath.   Cardiovascular:  Negative for chest pain.  Gastrointestinal:  Negative for heartburn, nausea and vomiting.  Genitourinary: Negative.   Musculoskeletal: Negative.  Negative for myalgias.  Skin:  Negative for rash.  Neurological:  Negative for dizziness and headaches.  Endo/Heme/Allergies:  Does not bruise/bleed easily.  Psychiatric/Behavioral:  Negative for depression. The patient is not nervous/anxious.      Physical Exam BP  138/62   Pulse (!) 54   Temp (!) 97.5 F (36.4 C) (Temporal)   Ht 5' 0.75" (1.543 m)   Wt 107 lb 4 oz (48.6 kg)   SpO2 100%   BMI 20.43 kg/m    BP Readings from Last 3 Encounters:  02/14/22 138/62  12/20/21 119/75  12/18/21 (!) 179/76      Physical Exam Constitutional:      General: She is not in acute distress.    Appearance: She is well-developed. She is not diaphoretic.  HENT:     Head: Normocephalic and atraumatic.     Right Ear: External ear normal.     Left Ear: External ear normal.     Nose: Nose normal.  Eyes:     General: No scleral icterus.    Extraocular Movements: Extraocular movements intact.     Conjunctiva/sclera: Conjunctivae normal.  Cardiovascular:     Rate and Rhythm: Normal rate and regular rhythm.     Heart sounds: Murmur heard.  Pulmonary:     Effort: Pulmonary effort is normal. No respiratory distress.     Breath sounds: Normal breath sounds. No wheezing.  Abdominal:     General: Bowel sounds are normal. There is no distension.     Palpations: Abdomen is soft. There is no mass.     Tenderness: There is no abdominal tenderness. There is no guarding or rebound.  Musculoskeletal:        General: Normal range of motion.     Cervical back: Neck supple.  Lymphadenopathy:     Cervical: No cervical adenopathy.  Skin:    General: Skin is warm and dry.     Capillary Refill: Capillary refill takes less than 2 seconds.  Neurological:     Mental Status: She is alert and oriented to person, place, and time.     Deep Tendon Reflexes: Reflexes normal.  Psychiatric:        Mood and Affect: Mood normal.  Behavior: Behavior normal.     Results:  PHQ-9:  Flowsheet Row Clinical Support from 01/16/2022 in [place] at [facility]  PHQ-9 Total Score 2         Assessment: 71 y.o. No obstetric history on file. female here for routine annual physical examination.  Plan: Problem List Items Addressed This Visit       Cardiovascular  and Mediastinum   Essential hypertension   Relevant Orders   Comprehensive metabolic panel   CBC     Endocrine   Hypothyroidism   Relevant Orders   TSH     Musculoskeletal and Integument   Osteoporosis    On meds since 2021. Cont fosamax. Repeat dexa in Jan. Return for results afterward.       Relevant Orders   Vitamin D, 25-hydroxy   DG Bone Density   Other Visit Diagnoses     Annual physical exam    -  Primary   Screening for hyperlipidemia       Relevant Orders   Lipid panel       Screening: -- Blood pressure screen normal -- cholesterol screening: will obtain -- Weight screening: normal -- Diabetes Screening: will obtain -- Nutrition: Encouraged healthy diet  The ASCVD Risk score (Arnett DK, et al., 2019) failed to calculate for the following reasons:   Cannot find a previous HDL lab   Cannot find a previous total cholesterol lab  -- Statin therapy for Age 27-75 with CVD risk >7.5%  Psych -- Depression screening (PHQ-9):  Flowsheet Row Clinical Support from 01/16/2022 in [place] at [facility]  PHQ-9 Total Score 2        Safety -- tobacco screening: not using -- alcohol screening:  low-risk usage. -- no evidence of domestic violence or intimate partner violence.   Cancer Screening -- pap smear not collected per ASCCP guidelines -- family history of breast cancer screening: done. not at high risk. -- Mammogram -  up to date -- Colon cancer (age 44+)--  up to date  Immunizations Immunization History  Administered Date(s) Administered   Influenza,inj,quad, With Preservative 04/03/2019   PFIZER Comirnaty(Gray Top)Covid-19 Tri-Sucrose Vaccine 08/02/2019, 08/27/2019, 04/12/2020, 01/12/2021   Pneumococcal Polysaccharide-23 04/02/2019   Zoster Recombinat (Shingrix) 04/02/2019, 07/10/2019    -- flu vaccine not up to date - pt will get later -- TDAP q10 years not up to date - get with pharmacy -- Shingles (age >66) up to date -- PPSV-23  (19-64 with chronic disease or smoking) up to date -- PCV-13 (age >63) - one dose followed by PPSV-23 1 year later unknown, record requested -- Covid-19 Vaccine up to date   Encouraged healthy diet and exercise. Encouraged regular vision and dental care.    Lesleigh Noe, MD

## 2022-02-14 NOTE — Assessment & Plan Note (Signed)
On meds since 2021. Cont fosamax. Repeat dexa in Jan. Return for results afterward.

## 2022-02-14 NOTE — Patient Instructions (Signed)
Squatty Potty - or stool when having a bowel movement

## 2022-02-15 ENCOUNTER — Telehealth: Payer: Self-pay | Admitting: Family Medicine

## 2022-02-15 NOTE — Telephone Encounter (Signed)
Pt stated she doesn't use her MyChart & requested a call back with her lab results.

## 2022-02-15 NOTE — Telephone Encounter (Signed)
Left detailed VM (per DPR) for pt, relaying lab result message from Dr. Einar Pheasant. Will also mail results to pt.

## 2022-02-21 ENCOUNTER — Emergency Department (HOSPITAL_COMMUNITY): Payer: Medicare PPO

## 2022-02-21 ENCOUNTER — Ambulatory Visit (INDEPENDENT_AMBULATORY_CARE_PROVIDER_SITE_OTHER)
Admission: EM | Admit: 2022-02-21 | Discharge: 2022-02-21 | Disposition: A | Discharge status: Other Acute Inpatient Hospital | Payer: Medicare PPO | Source: Home / Self Care

## 2022-02-21 ENCOUNTER — Observation Stay (HOSPITAL_COMMUNITY)
Admission: EM | Admit: 2022-02-21 | Discharge: 2022-02-24 | Disposition: A | Discharge status: Home / Self Care | Payer: Medicare PPO | Attending: Surgery | Admitting: Surgery

## 2022-02-21 ENCOUNTER — Encounter (HOSPITAL_COMMUNITY): Payer: Self-pay

## 2022-02-21 ENCOUNTER — Other Ambulatory Visit: Payer: Self-pay

## 2022-02-21 ENCOUNTER — Encounter: Payer: Self-pay | Admitting: Emergency Medicine

## 2022-02-21 DIAGNOSIS — R1084 Generalized abdominal pain: Secondary | ICD-10-CM | POA: Insufficient documentation

## 2022-02-21 DIAGNOSIS — I10 Essential (primary) hypertension: Secondary | ICD-10-CM | POA: Diagnosis not present

## 2022-02-21 DIAGNOSIS — K4020 Bilateral inguinal hernia, without obstruction or gangrene, not specified as recurrent: Secondary | ICD-10-CM | POA: Diagnosis not present

## 2022-02-21 DIAGNOSIS — Z79899 Other long term (current) drug therapy: Secondary | ICD-10-CM | POA: Insufficient documentation

## 2022-02-21 DIAGNOSIS — Z87891 Personal history of nicotine dependence: Secondary | ICD-10-CM | POA: Insufficient documentation

## 2022-02-21 DIAGNOSIS — R0789 Other chest pain: Secondary | ICD-10-CM | POA: Insufficient documentation

## 2022-02-21 DIAGNOSIS — K4131 Unilateral femoral hernia, with obstruction, without gangrene, recurrent: Principal | ICD-10-CM | POA: Insufficient documentation

## 2022-02-21 DIAGNOSIS — I160 Hypertensive urgency: Secondary | ICD-10-CM | POA: Insufficient documentation

## 2022-02-21 DIAGNOSIS — E039 Hypothyroidism, unspecified: Secondary | ICD-10-CM | POA: Diagnosis not present

## 2022-02-21 DIAGNOSIS — K4090 Unilateral inguinal hernia, without obstruction or gangrene, not specified as recurrent: Secondary | ICD-10-CM

## 2022-02-21 HISTORY — DX: Essential (primary) hypertension: I10

## 2022-02-21 LAB — CBC WITH DIFFERENTIAL/PLATELET
Abs Immature Granulocytes: 0.01 10*3/uL (ref 0.00–0.07)
Basophils Absolute: 0 10*3/uL (ref 0.0–0.1)
Basophils Relative: 1 %
Eosinophils Absolute: 0.1 10*3/uL (ref 0.0–0.5)
Eosinophils Relative: 3 %
HCT: 35.5 % — ABNORMAL LOW (ref 36.0–46.0)
Hemoglobin: 12.3 g/dL (ref 12.0–15.0)
Immature Granulocytes: 0 %
Lymphocytes Relative: 23 %
Lymphs Abs: 1.2 10*3/uL (ref 0.7–4.0)
MCH: 34.7 pg — ABNORMAL HIGH (ref 26.0–34.0)
MCHC: 34.6 g/dL (ref 30.0–36.0)
MCV: 100.3 fL — ABNORMAL HIGH (ref 80.0–100.0)
Monocytes Absolute: 0.7 10*3/uL (ref 0.1–1.0)
Monocytes Relative: 14 %
Neutro Abs: 3.2 10*3/uL (ref 1.7–7.7)
Neutrophils Relative %: 59 %
Platelets: 334 10*3/uL (ref 150–400)
RBC: 3.54 MIL/uL — ABNORMAL LOW (ref 3.87–5.11)
RDW: 12.4 % (ref 11.5–15.5)
WBC: 5.3 10*3/uL (ref 4.0–10.5)
nRBC: 0 % (ref 0.0–0.2)

## 2022-02-21 LAB — COMPREHENSIVE METABOLIC PANEL
ALT: 14 U/L (ref 0–44)
AST: 19 U/L (ref 15–41)
Albumin: 4.2 g/dL (ref 3.5–5.0)
Alkaline Phosphatase: 56 U/L (ref 38–126)
Anion gap: 10 (ref 5–15)
BUN: 16 mg/dL (ref 8–23)
CO2: 27 mmol/L (ref 22–32)
Calcium: 9.5 mg/dL (ref 8.9–10.3)
Chloride: 97 mmol/L — ABNORMAL LOW (ref 98–111)
Creatinine, Ser: 0.69 mg/dL (ref 0.44–1.00)
GFR, Estimated: >60 mL/min (ref >60)
Glucose, Bld: 101 mg/dL — ABNORMAL HIGH (ref 70–99)
Potassium: 4.4 mmol/L (ref 3.5–5.1)
Sodium: 134 mmol/L — ABNORMAL LOW (ref 135–145)
Total Bilirubin: 0.4 mg/dL (ref 0.3–1.2)
Total Protein: 7.3 g/dL (ref 6.5–8.1)

## 2022-02-21 LAB — I-STAT CHEM 8, ED
BUN: 17 mg/dL (ref 8–23)
Calcium, Ion: 1.19 mmol/L (ref 1.15–1.40)
Chloride: 96 mmol/L — ABNORMAL LOW (ref 98–111)
Creatinine, Ser: 0.6 mg/dL (ref 0.44–1.00)
Glucose, Bld: 96 mg/dL (ref 70–99)
HCT: 39 % (ref 36.0–46.0)
Hemoglobin: 13.3 g/dL (ref 12.0–15.0)
Potassium: 4.4 mmol/L (ref 3.5–5.1)
Sodium: 132 mmol/L — ABNORMAL LOW (ref 135–145)
TCO2: 27 mmol/L (ref 22–32)

## 2022-02-21 LAB — TROPONIN I (HIGH SENSITIVITY)
Troponin I (High Sensitivity): 5 ng/L (ref <18)
Troponin I (High Sensitivity): 5 ng/L (ref <18)

## 2022-02-21 LAB — LIPASE, BLOOD: Lipase: 43 U/L (ref 11–51)

## 2022-02-21 MED ORDER — KETOROLAC TROMETHAMINE 15 MG/ML IJ SOLN
15.0000 mg | Freq: Four times a day (QID) | INTRAMUSCULAR | Status: DC
  Administered 2022-02-22 (×2): 15 mg via INTRAVENOUS
  Filled 2022-02-21 (×4): qty 1

## 2022-02-21 MED ORDER — ENOXAPARIN SODIUM 40 MG/0.4ML IJ SOSY
40.0000 mg | PREFILLED_SYRINGE | INTRAMUSCULAR | Status: DC
  Administered 2022-02-22 – 2022-02-24 (×2): 40 mg via SUBCUTANEOUS
  Filled 2022-02-21 (×3): qty 0.4

## 2022-02-21 MED ORDER — ONDANSETRON HCL 4 MG/2ML IJ SOLN
4.0000 mg | Freq: Once | INTRAMUSCULAR | Status: AC
  Administered 2022-02-21: 4 mg via INTRAVENOUS
  Filled 2022-02-21: qty 2

## 2022-02-21 MED ORDER — IOHEXOL 350 MG/ML SOLN
100.0000 mL | Freq: Once | INTRAVENOUS | Status: AC | PRN
  Administered 2022-02-21: 100 mL via INTRAVENOUS

## 2022-02-21 MED ORDER — MORPHINE SULFATE (PF) 4 MG/ML IV SOLN
4.0000 mg | Freq: Once | INTRAVENOUS | Status: AC
  Administered 2022-02-21: 4 mg via INTRAVENOUS
  Filled 2022-02-21: qty 1

## 2022-02-21 MED ORDER — MORPHINE SULFATE (PF) 2 MG/ML IV SOLN: 1.0000 mg | INTRAVENOUS | Status: DC | PRN

## 2022-02-21 MED ORDER — DOCUSATE SODIUM 100 MG PO CAPS
100.0000 mg | ORAL_CAPSULE | Freq: Two times a day (BID) | ORAL | Status: DC
  Administered 2022-02-22 – 2022-02-24 (×5): 100 mg via ORAL
  Filled 2022-02-21 (×5): qty 1

## 2022-02-21 MED ORDER — OXYCODONE HCL 5 MG PO TABS: 2.5000 mg | ORAL_TABLET | ORAL | Status: DC | PRN

## 2022-02-21 MED ORDER — ACETAMINOPHEN 500 MG PO TABS
1000.0000 mg | ORAL_TABLET | Freq: Four times a day (QID) | ORAL | Status: DC
  Administered 2022-02-22 – 2022-02-24 (×10): 1000 mg via ORAL
  Filled 2022-02-21 (×12): qty 2

## 2022-02-21 MED ORDER — ONDANSETRON HCL 4 MG/2ML IJ SOLN
4.0000 mg | Freq: Four times a day (QID) | INTRAMUSCULAR | Status: DC | PRN
  Administered 2022-02-23 – 2022-02-24 (×3): 4 mg via INTRAVENOUS
  Filled 2022-02-21 (×3): qty 2

## 2022-02-21 MED ORDER — METHOCARBAMOL 1000 MG/10ML IJ SOLN
1000.0000 mg | Freq: Three times a day (TID) | INTRAVENOUS | Status: DC
  Administered 2022-02-22: 1000 mg via INTRAVENOUS
  Filled 2022-02-21: qty 1000

## 2022-02-21 MED ORDER — HYDRALAZINE HCL 20 MG/ML IJ SOLN
10.0000 mg | Freq: Once | INTRAMUSCULAR | Status: AC
  Administered 2022-02-21: 10 mg via INTRAVENOUS
  Filled 2022-02-21 (×2): qty 1

## 2022-02-21 MED ORDER — ONDANSETRON 4 MG PO TBDP: 4.0000 mg | ORAL_TABLET | Freq: Four times a day (QID) | ORAL | Status: DC | PRN

## 2022-02-21 MED ORDER — LACTATED RINGERS IV SOLN: INTRAVENOUS | Status: DC

## 2022-02-21 NOTE — ED Provider Notes (Signed)
[facility]    CSN: [id number] Arrival date & time: 02/21/22  1735      History   Chief Complaint Chief Complaint  Patient presents with   Abdominal Pain    HPI Megan Stokes is a 71 y.o. female.   Patient presents with chest pain and generalized abdominal pain that has been present since about 5:30 AM this morning.  Patient reports it is rated 7/10 on pain scale but is not able to characterize pain.  She states it is a constant pain.  Denies nausea, vomiting, diarrhea, constipation, dizziness, headache, blurred vision, shortness of breath.  Patient reports that she has a history of diverticulitis but states that it that is typically left lower quadrant pain so this "feels different".  Patient does have a history of hypertension and has taken her lisinopril as prescribed.  She states that she typically takes it at nighttime.   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      Home Medications    Prior to Admission medications   Medication Sig Start Date End Date Taking?  Authorizing Provider  alendronate (FOSAMAX) 70 MG tablet Take 1 tablet (70 mg total) by mouth once a week. 09/28/21   Lesleigh Noe, MD  levothyroxine (SYNTHROID, LEVOTHROID) 75 MCG tablet Take 75 mcg by mouth daily before breakfast.    [provider]  lisinopril (ZESTRIL) 20 MG tablet Take 1 tablet (20 mg total) by mouth daily. 11/02/21   Lesleigh Noe, MD  lovastatin (MEVACOR) 40 MG tablet TAKE 1 TABLET BY MOUTH ONCE DAILY 10/05/21   Lesleigh Noe, MD  meloxicam (MOBIC) 7.5 MG tablet Take 1 tablet (7.5 mg total) by mouth daily. 10/25/21   Lesleigh Noe, MD  ondansetron (ZOFRAN-ODT) 4 MG disintegrating tablet Take 1 tablet (4 mg total) by mouth every 8 (eight) hours as needed for nausea or vomiting. 12/20/21   Hazel Sams, PA-C    Family History Family History  Problem Relation Age of Onset   Stroke Mother     Social History Social History   Tobacco Use   Smoking status: Former   Smokeless tobacco: Never  Scientific laboratory technician Use: Never used  Substance Use Topics   Alcohol use: Yes    Alcohol/week: 2.0 - 3.0 standard drinks of alcohol    Types: 2 - 3 Glasses of wine per week    Comment: 2 glasses nightly   Drug use: No     Allergies  Augmentin [amoxicillin-pot clavulanate] and Codeine   Review of Systems Review of Systems Per HPI  Physical Exam Triage Vital Signs ED Triage Vitals  Enc Vitals Group     BP 02/21/22 1747 (!) 217/89     Pulse Rate 02/21/22 1747 65     Resp 02/21/22 1747 18     Temp 02/21/22 1747 98.1 F (36.7 C)     Temp Source 02/21/22 1747 Oral     SpO2 02/21/22 1747 98 %     Weight --      Height --      Head Circumference --      Peak Flow --      Pain Score 02/21/22 1748 7     Pain Loc --      Pain Edu? --      Excl. in [place]? --    No data found.  Updated Vital Signs BP (!) 217/89 (BP Location: Left Arm)   Pulse 65   Temp 98.1 F (36.7 C) (Oral)   Resp 18   SpO2 98%   Visual Acuity Right Eye Distance:   Left Eye  Distance:   Bilateral Distance:    Right Eye Near:   Left Eye Near:    Bilateral Near:     Physical Exam Constitutional:      General: She is not in acute distress.    Appearance: Normal appearance. She is not toxic-appearing or diaphoretic.  HENT:     Head: Normocephalic and atraumatic.  Eyes:     Extraocular Movements: Extraocular movements intact.     Conjunctiva/sclera: Conjunctivae normal.  Cardiovascular:     Rate and Rhythm: Normal rate and regular rhythm.     Pulses: Normal pulses.     Heart sounds: Normal heart sounds.  Pulmonary:     Effort: Pulmonary effort is normal. No respiratory distress.     Breath sounds: Normal breath sounds.  Abdominal:     General: Bowel sounds are normal. There is no distension.     Palpations: Abdomen is soft.     Tenderness: There is generalized abdominal tenderness. There is guarding.  Neurological:     General: No focal deficit present.     Mental Status: She is alert and oriented to person, place, and time. Mental status is at baseline.  Psychiatric:        Mood and Affect: Mood normal.        Behavior: Behavior normal.        Thought Content: Thought content normal.        Judgment: Judgment normal.      UC Treatments / Results  Labs (all labs ordered are listed, but only abnormal results are displayed) Labs Reviewed - No data to display  EKG   Radiology No results found.  Procedures Procedures (including critical care time)  Medications Ordered in UC Medications - No data to display  Initial Impression / Assessment and Plan / UC Course  I have reviewed the triage vital signs and the nursing notes.  Pertinent labs & imaging results that were available during my care of the patient were reviewed by me and considered in my medical decision making (see chart for details).     EKG sinus bradycardia.  Patient has significantly elevated blood pressure reading with retake being similar.  Given associated chest pain,  elevated blood pressure reading, sudden onset of severe abdominal pain, I do think this warrants further evaluation and management at the hospital given limited resources here  in urgent care.  Advised patient to go to the hospital for further evaluation and management.  Patient was agreeable with plan.  Suggested EMS transport but patient declined.  Risks associated with not going by EMS were discussed with patient.  Patient voiced understanding and wished for her son to transport her.  Patient left via her son transporting her to the hospital. Final Clinical Impressions(s) / UC Diagnoses   Final diagnoses:  Other chest pain  Generalized abdominal pain  Hypertensive urgency     Discharge Instructions      Go to the emergency department as soon as you leave urgent care for further evaluation and management.    ED Prescriptions   None    PDMP not reviewed this encounter.   Teodora Medici, Kathleen 02/21/22 1755

## 2022-02-21 NOTE — ED Triage Notes (Signed)
Complains of chest pain and abd pain that goes into back that started this am.  Denies sob, nausea, dizziness. +diaphoresis at times.

## 2022-02-21 NOTE — ED Notes (Signed)
Patient is being discharged from the Urgent Care and sent to the Emergency Department via POV . Per HM, patient is in need of higher level of care due to cp. Patient is aware and verbalizes understanding of plan of care.  Vitals:   02/21/22 1747  BP: (!) 217/89  Pulse: 65  Resp: 18  Temp: 98.1 F (36.7 C)  SpO2: 98%

## 2022-02-21 NOTE — ED Provider Notes (Signed)
[facility] EMERGENCY DEPARTMENT Provider Note   CSN: [id number] Arrival date & time: 02/21/22  1815     History  Chief Complaint  Patient presents with   Chest Pain   Abdominal Pain    Megan Stokes is a 71 y.o. female here presenting with chest pain and abdominal pain.  Patient states that she has lower abdominal pain for several months now.  However this morning, she had a cute onset of severe lower abdominal pain that radiated to her chest and her back.  She went to urgent care was noted to be hypertensive.  Denies any history of CAD.   The history is provided by the patient.       Home Medications Prior to Admission medications   Medication Sig Start Date End Date Taking? Authorizing Provider  alendronate (FOSAMAX) 70 MG tablet Take 1 tablet (70 mg total) by mouth once a week. 09/28/21   Lesleigh Noe, MD  levothyroxine (SYNTHROID, LEVOTHROID) 75 MCG tablet Take 75 mcg by mouth daily before breakfast.    [provider]  lisinopril (ZESTRIL) 20 MG tablet Take 1 tablet (20 mg total) by mouth daily. 11/02/21   Lesleigh Noe, MD  lovastatin (MEVACOR) 40 MG tablet TAKE 1 TABLET BY MOUTH ONCE DAILY 10/05/21   Lesleigh Noe, MD  meloxicam (MOBIC) 7.5 MG tablet Take 1 tablet (7.5 mg total) by mouth daily. 10/25/21   Lesleigh Noe, MD  ondansetron (ZOFRAN-ODT) 4 MG disintegrating tablet Take 1 tablet (4 mg total) by mouth every 8 (eight) hours as needed for nausea or vomiting. 12/20/21   Hazel Sams, PA-C      Allergies    Augmentin [amoxicillin-pot clavulanate] and Codeine    Review of Systems   Review of Systems  Cardiovascular:  Positive for chest pain.  Gastrointestinal:  Positive for abdominal pain.  All other systems reviewed and are negative.   Physical Exam Updated Vital Signs BP (!) 150/81   Pulse 71   Temp 98.3 F (36.8 C) (Oral)   Resp 13   Ht 5' (1.524 m)   Wt 48.5 kg   SpO2 97%   BMI 20.90 kg/m  Physical Exam Vitals  and nursing note reviewed.  Constitutional:      Comments: Uncomfortable  HENT:     Head: Normocephalic.  Eyes:     Extraocular Movements: Extraocular movements intact.     Pupils: Pupils are equal, round, and reactive to light.  Cardiovascular:     Rate and Rhythm: Normal rate and regular rhythm.     Heart sounds: Normal heart sounds.  Pulmonary:     Effort: Pulmonary effort is normal.     Breath sounds: Normal breath sounds.  Abdominal:     Comments: Mild diffuse tenderness, questionable left femoral hernia  Musculoskeletal:        General: Normal range of motion.     Cervical back: Normal range of motion and neck supple.  Skin:    General: Skin is warm.     Capillary Refill: Capillary refill takes less than 2 seconds.  Neurological:     General: No focal deficit present.     Mental Status: She is alert and oriented to person, place, and time.  Psychiatric:        Mood and Affect: Mood normal.        Behavior: Behavior normal.     ED Results / Procedures / Treatments   Labs (all labs ordered are  listed, but only abnormal results are displayed) Labs Reviewed  CBC WITH DIFFERENTIAL/PLATELET - Abnormal; Notable for the following components:      Result Value   RBC 3.54 (*)    HCT 35.5 (*)    MCV 100.3 (*)    MCH 34.7 (*)    All other components within normal limits  COMPREHENSIVE METABOLIC PANEL - Abnormal; Notable for the following components:   Sodium 134 (*)    Chloride 97 (*)    Glucose, Bld 101 (*)    All other components within normal limits  I-STAT CHEM 8, ED - Abnormal; Notable for the following components:   Sodium 132 (*)    Chloride 96 (*)    All other components within normal limits  LIPASE, BLOOD  TROPONIN I (HIGH SENSITIVITY)  TROPONIN I (HIGH SENSITIVITY)    EKG EKG Interpretation  Date/Time:  Tuesday February 21 2022 19:33:41 EDT Ventricular Rate:  60 PR Interval:  164 QRS Duration: 90 QT Interval:  445 QTC Calculation: 445 R  Axis:   79 Text Interpretation: Sinus rhythm RSR' in V1 or V2, right VCD or RVH No significant change since last tracing Confirmed by Wandra Arthurs ([id number]) on 02/21/2022 7:55:30 PM  Radiology DG Chest Port 1 View  Result Date: 02/21/2022 CLINICAL DATA:  Chest pain. EXAM: PORTABLE CHEST 1 VIEW COMPARISON:  Chest radiograph dated 07/13/2010 and CT dated 07/13/2010 FINDINGS: No focal consolidation, pleural effusion, or pneumothorax. The cardiac silhouette is within limits. Atherosclerotic calcification of the aortic arch. No acute osseous pathology. Partially visualized left shoulder arthroplasty. IMPRESSION: No active disease. Electronically Signed   By: Anner Crete M.D.   On: 02/21/2022 19:42    Procedures Procedures    Medications Ordered in ED Medications  morphine (PF) 4 MG/ML injection 4 mg (4 mg Intravenous Given 02/21/22 1944)  ondansetron (ZOFRAN) injection 4 mg (4 mg Intravenous Given 02/21/22 1943)  hydrALAZINE (APRESOLINE) injection 10 mg (10 mg Intravenous Given 02/21/22 2057)  iohexol (OMNIPAQUE) 350 MG/ML injection 100 mL (100 mLs Intravenous Contrast Given 02/21/22 2205)    ED Course/ Medical Decision Making/ A&P                           Medical Decision Making Megan Stokes is a 71 y.o. female here presenting with abdominal pain and chest pain.  Patient was hypertensive on arrival.  Concern for possible dissection versus bowel obstruction.  We will get dissection study and will give IV pain medicine and BP medicine  11:32 PM Dissection study showed left femoral hernia with bowel obstruction.  I was unable to reduce the hernia despite pain meds.  Consulted Dr. Bobbye Morton from surgery to see patient.   Amount and/or Complexity of Data Reviewed Labs: ordered. Decision-making details documented in ED Course. Radiology: ordered and independent interpretation performed. Decision-making details documented in ED Course. ECG/medicine tests: ordered and independent interpretation  performed. Decision-making details documented in ED Course.  Risk Prescription drug management. Decision regarding hospitalization.    Final Clinical Impression(s) / ED Diagnoses Final diagnoses:  None    Rx / DC Orders ED Discharge Orders     None         Drenda Freeze, MD 02/21/22 2332

## 2022-02-21 NOTE — ED Triage Notes (Signed)
Pt here for generalized abd pain into chest starting today; pt sts pain is constant but denies N/V/D

## 2022-02-21 NOTE — H&P (Signed)
Reason for Consult/Chief Complaint:*** Consultant: ***, {Blank single:19197::"DO","PA","MD"}  Megan Stokes is an 71 y.o. female.   HPI: ***  Past Medical History:  Diagnosis Date   Hypertension    Hypothyroidism    PONV (postoperative nausea and vomiting)     Past Surgical History:  Procedure Laterality Date   BREAST BIOPSY Left    CESAREAN SECTION     X 2   HEMORROIDECTOMY     SHOULDER SURGERY Left    Pt had limited movement with left shoulder/arm   THYROID CYST EXCISION      Family History  Problem Relation Age of Onset   Stroke Mother     Social History:  reports that she has quit smoking. She has never used smokeless tobacco. She reports current alcohol use of about 2.0 - 3.0 standard drinks of alcohol per week. She reports that she does not use drugs.  Allergies:  Allergies  Allergen Reactions   Augmentin [Amoxicillin-Pot Clavulanate] Nausea And Vomiting    No hives or swelling. Profuse diarrhea, nausea, vomiting.    Codeine Nausea And Vomiting    Medications: I have reviewed the patient's current medications.  Results for orders placed or performed during the hospital encounter of 02/21/22 (from the past 48 hour(s))  CBC with Differential     Status: Abnormal   Collection Time: 02/21/22  7:08 PM  Result Value Ref Range   WBC 5.3 4.0 - 10.5 K/uL   RBC 3.54 (L) 3.87 - 5.11 MIL/uL   Hemoglobin 12.3 12.0 - 15.0 g/dL   HCT 35.5 (L) 36.0 - 46.0 %   MCV 100.3 (H) 80.0 - 100.0 fL   MCH 34.7 (H) 26.0 - 34.0 pg   MCHC 34.6 30.0 - 36.0 g/dL   RDW 12.4 11.5 - 15.5 %   Platelets 334 150 - 400 K/uL   nRBC 0.0 0.0 - 0.2 %   Neutrophils Relative % 59 %   Neutro Abs 3.2 1.7 - 7.7 K/uL   Lymphocytes Relative 23 %   Lymphs Abs 1.2 0.7 - 4.0 K/uL   Monocytes Relative 14 %   Monocytes Absolute 0.7 0.1 - 1.0 K/uL   Eosinophils Relative 3 %   Eosinophils Absolute 0.1 0.0 - 0.5 K/uL   Basophils Relative 1 %   Basophils Absolute 0.0 0.0 - 0.1 K/uL   Immature  Granulocytes 0 %   Abs Immature Granulocytes 0.01 0.00 - 0.07 K/uL    Comment: Performed at [facility], [address] [place], [place] [address]  Comprehensive metabolic panel     Status: Abnormal   Collection Time: 02/21/22  7:08 PM  Result Value Ref Range   Sodium 134 (L) 135 - 145 mmol/L   Potassium 4.4 3.5 - 5.1 mmol/L   Chloride 97 (L) 98 - 111 mmol/L   CO2 27 22 - 32 mmol/L   Glucose, Bld 101 (H) 70 - 99 mg/dL    Comment: Glucose reference range applies only to samples taken after fasting for at least 8 hours.   BUN 16 8 - 23 mg/dL   Creatinine, Ser 0.69 0.44 - 1.00 mg/dL   Calcium 9.5 8.9 - 10.3 mg/dL   Total Protein 7.3 6.5 - 8.1 g/dL   Albumin 4.2 3.5 - 5.0 g/dL   AST 19 15 - 41 U/L   ALT 14 0 - 44 U/L   Alkaline Phosphatase 56 38 - 126 U/L   Total Bilirubin 0.4 0.3 - 1.2 mg/dL  GFR, Estimated >60 >60 mL/min    Comment: (NOTE) Calculated using the CKD-EPI Creatinine Equation (2021)    Anion gap 10 5 - 15    Comment: Performed at [facility], [place] [address], [place], [place] [address]  Troponin I (High Sensitivity)     Status: None   Collection Time: 02/21/22  7:08 PM  Result Value Ref Range   Troponin I (High Sensitivity) 5 <18 ng/L    Comment: (NOTE) Elevated high sensitivity troponin I (hsTnI) values and significant  changes across serial measurements may suggest ACS but many other  chronic and acute conditions are known to elevate hsTnI results.  Refer to the "Links" section for chest pain algorithms and additional  guidance. Performed at [facility], [place] [address], [place], [place] [address]   Lipase, blood     Status: None   Collection Time: 02/21/22  7:08 PM  Result Value Ref Range   Lipase 43 11 - 51 U/L    Comment: Performed at [place] [address], [place], [place] [address]  I-stat chem 8, ED (not at [facility] or [facility])     Status: Abnormal   Collection Time: 02/21/22  7:34 PM  Result Value Ref Range   Sodium 132 (L)  135 - 145 mmol/L   Potassium 4.4 3.5 - 5.1 mmol/L   Chloride 96 (L) 98 - 111 mmol/L   BUN 17 8 - 23 mg/dL   Creatinine, Ser 0.60 0.44 - 1.00 mg/dL   Glucose, Bld 96 70 - 99 mg/dL    Comment: Glucose reference range applies only to samples taken after fasting for at least 8 hours.   Calcium, Ion 1.19 1.15 - 1.40 mmol/L   TCO2 27 22 - 32 mmol/L   Hemoglobin 13.3 12.0 - 15.0 g/dL   HCT 39.0 36.0 - 46.0 %  Troponin I (High Sensitivity)     Status: None   Collection Time: 02/21/22  9:18 PM  Result Value Ref Range   Troponin I (High Sensitivity) 5 <18 ng/L    Comment: (NOTE) Elevated high sensitivity troponin I (hsTnI) values and significant  changes across serial measurements may suggest ACS but many other  chronic and acute conditions are known to elevate hsTnI results.  Refer to the "Links" section for chest pain algorithms and additional  guidance. Performed at [facility], [place] [address], [place], [place] [address]     DG Chest Port 1 View  Result Date: 02/21/2022 CLINICAL DATA:  Chest pain. EXAM: PORTABLE CHEST 1 VIEW COMPARISON:  Chest radiograph dated 07/13/2010 and CT dated 07/13/2010 FINDINGS: No focal consolidation, pleural effusion, or pneumothorax. The cardiac silhouette is within limits. Atherosclerotic calcification of the aortic arch. No acute osseous pathology. Partially visualized left shoulder arthroplasty. IMPRESSION: No active disease. Electronically Signed   By: Anner Crete M.D.   On: 02/21/2022 19:42    ROS 10 point review of systems is negative except as listed above in HPI.   Physical Exam Blood pressure (!) 148/69, pulse 77, temperature 98.3 F (36.8 C), temperature source Oral, resp. rate 12, height 5' (1.524 m), weight 48.5 kg, SpO2 97 %. Constitutional: well-developed, well-nourished*** HEENT: pupils equal, round, reactive to light, 2***mm b/l, moist conjunctiva, external inspection of ears and nose normal, hearing {Blank  single:19197::"intact","diminished","unable to be assessed"} Oropharynx: normal oropharyngeal mucosa, {Blank single:19197::"normal","poor"} dentition Neck: no thyromegaly, trachea midline, {Blank single:19197::"+","no","unable to assess"} midline cervical tenderness to palpation Chest: breath sounds equal bilaterally, {Blank  single:19197::"normal","absent","labored"} respiratory effort, {Blank single:19197::"+","no"} midline or lateral chest wall tenderness to palpation/deformity Abdomen: soft, NT, no bruising, no hepatosplenomegaly GU: {Blank single:19197::"no blood at urethral meatus of penis, no scrotal masses or abnormality","normal female genitalia"}  Back: no wounds, {Blank single:19197::"+","no","unable to assess"} thoracic/lumbar spine tenderness to palpation, {Blank single:19197::"+","no"} thoracic/lumbar spine stepoffs Rectal: {Blank single:19197::"good tone, no blood","deferred"} Extremities: 2+ radial and pedal pulses bilaterally, {Blank single:19197::"intact","unable to assess"} motor and sensation bilateral UE and LE, {Blank single:19197::"+","no"} peripheral edema MSK: {Blank single:19197::"normal","abnormal","unable to assess"} gait/station, no clubbing/cyanosis of fingers/toes, {Blank single:19197::"normal","limited","unable to assess"} ROM of all four extremities Skin: warm, dry, no rashes Psych: {Blank single:19197::"normal memory, normal mood/affect","unable to assess"}     Assessment/Plan: ***   Jesusita Oka, MD General and [facility]

## 2022-02-21 NOTE — Discharge Instructions (Addendum)
Go to the emergency department as soon as you leave urgent care for further evaluation and management. 

## 2022-02-21 NOTE — ED Notes (Signed)
Patient transported to CT 

## 2022-02-21 NOTE — ED Notes (Signed)
Pt ambulated to bathroom independently

## 2022-02-22 LAB — CBC
HCT: 26.3 % — ABNORMAL LOW (ref 36.0–46.0)
Hemoglobin: 9.2 g/dL — ABNORMAL LOW (ref 12.0–15.0)
MCH: 35.5 pg — ABNORMAL HIGH (ref 26.0–34.0)
MCHC: 35 g/dL (ref 30.0–36.0)
MCV: 101.5 fL — ABNORMAL HIGH (ref 80.0–100.0)
Platelets: 222 10*3/uL (ref 150–400)
RBC: 2.59 MIL/uL — ABNORMAL LOW (ref 3.87–5.11)
RDW: 12.4 % (ref 11.5–15.5)
WBC: 3.9 10*3/uL — ABNORMAL LOW (ref 4.0–10.5)
nRBC: 0 % (ref 0.0–0.2)

## 2022-02-22 LAB — BASIC METABOLIC PANEL
Anion gap: 6 (ref 5–15)
BUN: 11 mg/dL (ref 8–23)
CO2: 23 mmol/L (ref 22–32)
Calcium: 8 mg/dL — ABNORMAL LOW (ref 8.9–10.3)
Chloride: 105 mmol/L (ref 98–111)
Creatinine, Ser: 0.63 mg/dL (ref 0.44–1.00)
GFR, Estimated: >60 mL/min (ref >60)
Glucose, Bld: 88 mg/dL (ref 70–99)
Potassium: 4.1 mmol/L (ref 3.5–5.1)
Sodium: 134 mmol/L — ABNORMAL LOW (ref 135–145)

## 2022-02-22 LAB — SURGICAL PCR SCREEN
MRSA, PCR: NEGATIVE
Staphylococcus aureus: NEGATIVE

## 2022-02-22 MED ORDER — OXYCODONE HCL 5 MG PO TABS
2.5000 mg | ORAL_TABLET | ORAL | Status: DC | PRN
  Administered 2022-02-22: 5 mg via ORAL
  Filled 2022-02-22: qty 1

## 2022-02-22 MED ORDER — LEVOTHYROXINE SODIUM 75 MCG PO TABS
75.0000 ug | ORAL_TABLET | Freq: Every day | ORAL | Status: DC
  Administered 2022-02-22 – 2022-02-24 (×3): 75 ug via ORAL
  Filled 2022-02-22 (×3): qty 1

## 2022-02-22 MED ORDER — MORPHINE SULFATE (PF) 2 MG/ML IV SOLN: 1.0000 mg | INTRAVENOUS | Status: DC | PRN

## 2022-02-22 MED ORDER — LISINOPRIL 20 MG PO TABS
20.0000 mg | ORAL_TABLET | Freq: Every day | ORAL | Status: DC
  Administered 2022-02-22 – 2022-02-24 (×3): 20 mg via ORAL
  Filled 2022-02-22 (×3): qty 1

## 2022-02-22 MED ORDER — METHOCARBAMOL 500 MG PO TABS
500.0000 mg | ORAL_TABLET | Freq: Four times a day (QID) | ORAL | Status: DC | PRN
Start: 2022-02-22 — End: 2022-02-24
  Administered 2022-02-23: 500 mg via ORAL
  Filled 2022-02-22: qty 1

## 2022-02-22 NOTE — ED Notes (Signed)
Surgery Cancelled for today 02/22/22

## 2022-02-22 NOTE — ED Notes (Signed)
Pt signed OP- Permit.

## 2022-02-22 NOTE — H&P (View-Only) (Signed)
Subjective: CC: Notes reviewed. EDP obtained CTA that was neg for dissection or aneurysm. ACS workup neg. CT did show LIH containing a short segment of small bowel. EDP was able to reduce this.   Patient reports since reduction of hernia her pain has resolved. No n/v. She reports the hernia has been present for at least 2 years. This is the first time she ever noticed a bulge or needed it reduced. Last episode of flatus and bm were both yesterday afternoon.   Objective: Vital signs in last 24 hours: Temp:  [97.8 F (36.6 C)-98.3 F (36.8 C)] 97.8 F (36.6 C) (09/06 0551) Pulse Rate:  [51-81] 58 (09/06 0551) Resp:  [9-31] 16 (09/06 0551) BP: (116-217)/(62-98) 116/68 (09/06 0551) SpO2:  [95 %-100 %] 98 % (09/06 0551) Weight:  [48.5 kg] 48.5 kg (09/05 1856)    Intake/Output from previous day: 09/05 0701 - 09/06 0700 In: 219.6 [I.V.:108.7; IV Piggyback:110.9] Out: -  Intake/Output this shift: No intake/output data recorded.  PE: Gen:  Alert, NAD, pleasant Card:  Reg Pulm:  CTAB, no W/R/R, effort normal Abd: Soft, ND, NT, +BS, LIH reduced Ext:  No LE edema  Psych: A&Ox3  Skin: no rashes noted, warm and dry  Lab Results:  Recent Labs    02/21/22 1908 02/21/22 1934 02/22/22 0544  WBC 5.3  --  3.9*  HGB 12.3 13.3 9.2*  HCT 35.5* 39.0 26.3*  PLT 334  --  222   BMET Recent Labs    02/21/22 1908 02/21/22 1934 02/22/22 0544  NA 134* 132* 134*  K 4.4 4.4 4.1  CL 97* 96* 105  CO2 27  --  23  GLUCOSE 101* 96 88  BUN '16 17 11  '$ CREATININE 0.69 0.60 0.63  CALCIUM 9.5  --  8.0*   PT/INR No results for input(s): "LABPROT", "INR" in the last 72 hours. CMP     Component Value Date/Time   NA 134 (L) 02/22/2022 0544   K 4.1 02/22/2022 0544   CL 105 02/22/2022 0544   CO2 23 02/22/2022 0544   GLUCOSE 88 02/22/2022 0544   BUN 11 02/22/2022 0544   CREATININE 0.63 02/22/2022 0544   CALCIUM 8.0 (L) 02/22/2022 0544   PROT 7.3 02/21/2022 1908   ALBUMIN 4.2  02/21/2022 1908   AST 19 02/21/2022 1908   ALT 14 02/21/2022 1908   ALKPHOS 56 02/21/2022 1908   BILITOT 0.4 02/21/2022 1908   GFRNONAA >60 02/22/2022 0544   GFRAA >90 02/13/2014 0711   Lipase     Component Value Date/Time   LIPASE 43 02/21/2022 1908    Studies/Results: CT Angio Chest/Abd/Pel for Dissection W and/or Wo Contrast  Result Date: 02/21/2022 CLINICAL DATA:  Acute aortic syndrome suspected. EXAM: CT ANGIOGRAPHY CHEST, ABDOMEN AND PELVIS TECHNIQUE: Non-contrast CT of the chest was initially obtained. Multidetector CT imaging through the chest, abdomen and pelvis was performed using the standard protocol during bolus administration of intravenous contrast. Multiplanar reconstructed images and MIPs were obtained and reviewed to evaluate the vascular anatomy. RADIATION DOSE REDUCTION: This exam was performed according to the departmental dose-optimization program which includes automated exposure control, adjustment of the mA and/or kV according to patient size and/or use of iterative reconstruction technique. CONTRAST:  153m OMNIPAQUE IOHEXOL 350 MG/ML SOLN COMPARISON:  Chest CT dated 07/13/2010. FINDINGS: Evaluation is limited due to streak artifact caused by left shoulder arthroplasty and spinal fusion hardware. CTA CHEST FINDINGS Cardiovascular: There is no cardiomegaly or pericardial effusion.  Mild atherosclerotic calcification of the thoracic aorta. No aneurysmal dilatation or dissection. No periaortic fluid collection. The origins of the great vessels of the aortic arch appear patent. No pulmonary artery embolus identified. Mediastinum/Nodes: No hilar or mediastinal adenopathy. The esophagus is grossly unremarkable. No mediastinal fluid collection. Lungs/Pleura: Small calcified granuloma in the left lower lobe. No focal consolidation, pleural effusion, pneumothorax. The central airways are patent. Musculoskeletal: Left shoulder arthroplasty. Osteopenia with degenerative changes of the  spine. No acute osseous pathology. Review of the MIP images confirms the above findings. CTA ABDOMEN AND PELVIS FINDINGS VASCULAR Aorta: Mild atherosclerotic calcification. No aneurysmal dilatation or dissection. No periaortic fluid collection. Celiac: The celiac artery is patent. SMA: The SMA is patent. Renals: The renal arteries are patent. IMA: Patent without evidence of aneurysm, dissection, vasculitis or significant stenosis. Inflow: Patent without evidence of aneurysm, dissection, vasculitis or significant stenosis. Veins: No obvious venous abnormality within the limitations of this arterial phase study. Review of the MIP images confirms the above findings. NON-VASCULAR No intra-abdominal free air or free fluid. Hepatobiliary: The liver is unremarkable. No biliary dilatation. The gallbladder is unremarkable Pancreas: Unremarkable. No pancreatic ductal dilatation or surrounding inflammatory changes. Spleen: Normal in size without focal abnormality. Adrenals/Urinary Tract: The adrenal glands are unremarkable. There is no hydronephrosis or nephrolithiasis on either side. The urinary bladder is grossly unremarkable. Stomach/Bowel: There is herniation of a short segment of small bowel into the left femoral hernia. There is pinching of the herniated bowel at the neck of the hernia. There is dilatation of the small bowel proximal to the hernia measuring up to 3.2 cm in diameter. There is distal colonic diverticulosis without active inflammatory changes. The appendix is normal. Lymphatic: No adenopathy. Reproductive: The uterus is anteverted and grossly unremarkable. No adnexal masses. Other: None Musculoskeletal: L4-L5 posterior fusion. Osteopenia with degenerative changes of the spine. No acute osseous pathology. Review of the MIP images confirms the above findings. IMPRESSION: 1. No aortic dissection or aneurysm. 2. Left femoral hernia containing a short segment of small bowel. There is associated small bowel  obstruction. 3. Colonic diverticulosis. Electronically Signed   By: Anner Crete M.D.   On: 02/21/2022 22:46   DG Chest Port 1 View  Result Date: 02/21/2022 CLINICAL DATA:  Chest pain. EXAM: PORTABLE CHEST 1 VIEW COMPARISON:  Chest radiograph dated 07/13/2010 and CT dated 07/13/2010 FINDINGS: No focal consolidation, pleural effusion, or pneumothorax. The cardiac silhouette is within limits. Atherosclerotic calcification of the aortic arch. No acute osseous pathology. Partially visualized left shoulder arthroplasty. IMPRESSION: No active disease. Electronically Signed   By: Anner Crete M.D.   On: 02/21/2022 19:42    Anti-infectives: Anti-infectives (From admission, onward)    None        Assessment/Plan L inguinal hernia   - S/p successful reduction by EDP, remains reduced - Discussed with patient, due to OR availability we will be unable to do surgery today. Options are either surgery tentatively in the AM pending OR availability vs PO challenge and outpatient follow up to discuss elective repair. She would like to think on this but is leaning towards staying to have this repair in the AM.  FEN - Reg, IVF at 41m/hr  VTE - SCDs, Lovenox ID - None currently.   HTN - home Lisinopril  Hypothyroidism - home Levothyroxine  Microcytic Anemia - Unclear etiology. ? Degree dilutional after IVF. Hgb 9.2 from baseline ~12. Recommend repeat labs prior to discharge to ensure stability. No tachycardia or hypotension.  LOS: 0 days    Megan Stokes , [facility] 02/22/2022, 7:23 AM Please see Amion for pager number during day hours 7:00am-4:30pm

## 2022-02-22 NOTE — ED Notes (Signed)
TC to 6N to get approval to bring Pt up to room

## 2022-02-22 NOTE — ED Notes (Signed)
Secure chat sent to provider for change in order for IV fluids. Requested to restart IV fluids when Pt is NPO for surgery.

## 2022-02-22 NOTE — ED Notes (Signed)
TC to OR time for surgery '@1330'$  on 02/22/22

## 2022-02-22 NOTE — Progress Notes (Signed)
Subjective: CC: Notes reviewed. EDP obtained CTA that was neg for dissection or aneurysm. ACS workup neg. CT did show LIH containing a short segment of small bowel. EDP was able to reduce this.   Patient reports since reduction of hernia her pain has resolved. No n/v. She reports the hernia has been present for at least 2 years. This is the first time she ever noticed a bulge or needed it reduced. Last episode of flatus and bm were both yesterday afternoon.   Objective: Vital signs in last 24 hours: Temp:  [97.8 F (36.6 C)-98.3 F (36.8 C)] 97.8 F (36.6 C) (09/06 0551) Pulse Rate:  [51-81] 58 (09/06 0551) Resp:  [9-31] 16 (09/06 0551) BP: (116-217)/(62-98) 116/68 (09/06 0551) SpO2:  [95 %-100 %] 98 % (09/06 0551) Weight:  [48.5 kg] 48.5 kg (09/05 1856)    Intake/Output from previous day: 09/05 0701 - 09/06 0700 In: 219.6 [I.V.:108.7; IV Piggyback:110.9] Out: -  Intake/Output this shift: No intake/output data recorded.  PE: Gen:  Alert, NAD, pleasant Card:  Reg Pulm:  CTAB, no W/R/R, effort normal Abd: Soft, ND, NT, +BS, LIH reduced Ext:  No LE edema  Psych: A&Ox3  Skin: no rashes noted, warm and dry  Lab Results:  Recent Labs    02/21/22 1908 02/21/22 1934 02/22/22 0544  WBC 5.3  --  3.9*  HGB 12.3 13.3 9.2*  HCT 35.5* 39.0 26.3*  PLT 334  --  222   BMET Recent Labs    02/21/22 1908 02/21/22 1934 02/22/22 0544  NA 134* 132* 134*  K 4.4 4.4 4.1  CL 97* 96* 105  CO2 27  --  23  GLUCOSE 101* 96 88  BUN '16 17 11  '$ CREATININE 0.69 0.60 0.63  CALCIUM 9.5  --  8.0*   PT/INR No results for input(s): "LABPROT", "INR" in the last 72 hours. CMP     Component Value Date/Time   NA 134 (L) 02/22/2022 0544   K 4.1 02/22/2022 0544   CL 105 02/22/2022 0544   CO2 23 02/22/2022 0544   GLUCOSE 88 02/22/2022 0544   BUN 11 02/22/2022 0544   CREATININE 0.63 02/22/2022 0544   CALCIUM 8.0 (L) 02/22/2022 0544   PROT 7.3 02/21/2022 1908   ALBUMIN 4.2  02/21/2022 1908   AST 19 02/21/2022 1908   ALT 14 02/21/2022 1908   ALKPHOS 56 02/21/2022 1908   BILITOT 0.4 02/21/2022 1908   GFRNONAA >60 02/22/2022 0544   GFRAA >90 02/13/2014 0711   Lipase     Component Value Date/Time   LIPASE 43 02/21/2022 1908    Studies/Results: CT Angio Chest/Abd/Pel for Dissection W and/or Wo Contrast  Result Date: 02/21/2022 CLINICAL DATA:  Acute aortic syndrome suspected. EXAM: CT ANGIOGRAPHY CHEST, ABDOMEN AND PELVIS TECHNIQUE: Non-contrast CT of the chest was initially obtained. Multidetector CT imaging through the chest, abdomen and pelvis was performed using the standard protocol during bolus administration of intravenous contrast. Multiplanar reconstructed images and MIPs were obtained and reviewed to evaluate the vascular anatomy. RADIATION DOSE REDUCTION: This exam was performed according to the departmental dose-optimization program which includes automated exposure control, adjustment of the mA and/or kV according to patient size and/or use of iterative reconstruction technique. CONTRAST:  138m OMNIPAQUE IOHEXOL 350 MG/ML SOLN COMPARISON:  Chest CT dated 07/13/2010. FINDINGS: Evaluation is limited due to streak artifact caused by left shoulder arthroplasty and spinal fusion hardware. CTA CHEST FINDINGS Cardiovascular: There is no cardiomegaly or pericardial effusion.  Mild atherosclerotic calcification of the thoracic aorta. No aneurysmal dilatation or dissection. No periaortic fluid collection. The origins of the great vessels of the aortic arch appear patent. No pulmonary artery embolus identified. Mediastinum/Nodes: No hilar or mediastinal adenopathy. The esophagus is grossly unremarkable. No mediastinal fluid collection. Lungs/Pleura: Small calcified granuloma in the left lower lobe. No focal consolidation, pleural effusion, pneumothorax. The central airways are patent. Musculoskeletal: Left shoulder arthroplasty. Osteopenia with degenerative changes of the  spine. No acute osseous pathology. Review of the MIP images confirms the above findings. CTA ABDOMEN AND PELVIS FINDINGS VASCULAR Aorta: Mild atherosclerotic calcification. No aneurysmal dilatation or dissection. No periaortic fluid collection. Celiac: The celiac artery is patent. SMA: The SMA is patent. Renals: The renal arteries are patent. IMA: Patent without evidence of aneurysm, dissection, vasculitis or significant stenosis. Inflow: Patent without evidence of aneurysm, dissection, vasculitis or significant stenosis. Veins: No obvious venous abnormality within the limitations of this arterial phase study. Review of the MIP images confirms the above findings. NON-VASCULAR No intra-abdominal free air or free fluid. Hepatobiliary: The liver is unremarkable. No biliary dilatation. The gallbladder is unremarkable Pancreas: Unremarkable. No pancreatic ductal dilatation or surrounding inflammatory changes. Spleen: Normal in size without focal abnormality. Adrenals/Urinary Tract: The adrenal glands are unremarkable. There is no hydronephrosis or nephrolithiasis on either side. The urinary bladder is grossly unremarkable. Stomach/Bowel: There is herniation of a short segment of small bowel into the left femoral hernia. There is pinching of the herniated bowel at the neck of the hernia. There is dilatation of the small bowel proximal to the hernia measuring up to 3.2 cm in diameter. There is distal colonic diverticulosis without active inflammatory changes. The appendix is normal. Lymphatic: No adenopathy. Reproductive: The uterus is anteverted and grossly unremarkable. No adnexal masses. Other: None Musculoskeletal: L4-L5 posterior fusion. Osteopenia with degenerative changes of the spine. No acute osseous pathology. Review of the MIP images confirms the above findings. IMPRESSION: 1. No aortic dissection or aneurysm. 2. Left femoral hernia containing a short segment of small bowel. There is associated small bowel  obstruction. 3. Colonic diverticulosis. Electronically Signed   By: Anner Crete M.D.   On: 02/21/2022 22:46   DG Chest Port 1 View  Result Date: 02/21/2022 CLINICAL DATA:  Chest pain. EXAM: PORTABLE CHEST 1 VIEW COMPARISON:  Chest radiograph dated 07/13/2010 and CT dated 07/13/2010 FINDINGS: No focal consolidation, pleural effusion, or pneumothorax. The cardiac silhouette is within limits. Atherosclerotic calcification of the aortic arch. No acute osseous pathology. Partially visualized left shoulder arthroplasty. IMPRESSION: No active disease. Electronically Signed   By: Anner Crete M.D.   On: 02/21/2022 19:42    Anti-infectives: Anti-infectives (From admission, onward)    None        Assessment/Plan L inguinal hernia   - S/p successful reduction by EDP, remains reduced - Discussed with patient, due to OR availability we will be unable to do surgery today. Options are either surgery tentatively in the AM pending OR availability vs PO challenge and outpatient follow up to discuss elective repair. She would like to think on this but is leaning towards staying to have this repair in the AM.  FEN - Reg, IVF at 65m/hr  VTE - SCDs, Lovenox ID - None currently.   HTN - home Lisinopril  Hypothyroidism - home Levothyroxine  Microcytic Anemia - Unclear etiology. ? Degree dilutional after IVF. Hgb 9.2 from baseline ~12. Recommend repeat labs prior to discharge to ensure stability. No tachycardia or hypotension.  LOS: 0 days    Megan Stokes , [facility] 02/22/2022, 7:23 AM Please see Amion for pager number during day hours 7:00am-4:30pm

## 2022-02-23 ENCOUNTER — Observation Stay (HOSPITAL_COMMUNITY): Payer: Medicare PPO | Admitting: Anesthesiology

## 2022-02-23 ENCOUNTER — Encounter (HOSPITAL_COMMUNITY)
Admission: EM | Disposition: A | Discharge status: Home / Self Care | Payer: Self-pay | Source: Home / Self Care | Attending: Emergency Medicine

## 2022-02-23 ENCOUNTER — Observation Stay (HOSPITAL_BASED_OUTPATIENT_CLINIC_OR_DEPARTMENT_OTHER): Payer: Medicare PPO | Admitting: Anesthesiology

## 2022-02-23 ENCOUNTER — Encounter (HOSPITAL_COMMUNITY): Payer: Self-pay

## 2022-02-23 DIAGNOSIS — K4190 Unilateral femoral hernia, without obstruction or gangrene, not specified as recurrent: Secondary | ICD-10-CM | POA: Diagnosis not present

## 2022-02-23 DIAGNOSIS — K4020 Bilateral inguinal hernia, without obstruction or gangrene, not specified as recurrent: Secondary | ICD-10-CM

## 2022-02-23 DIAGNOSIS — I10 Essential (primary) hypertension: Secondary | ICD-10-CM | POA: Diagnosis not present

## 2022-02-23 DIAGNOSIS — Z87891 Personal history of nicotine dependence: Secondary | ICD-10-CM

## 2022-02-23 HISTORY — PX: INGUINAL HERNIA REPAIR: SHX194

## 2022-02-23 LAB — BASIC METABOLIC PANEL
Anion gap: 8 (ref 5–15)
BUN: 18 mg/dL (ref 8–23)
CO2: 25 mmol/L (ref 22–32)
Calcium: 9 mg/dL (ref 8.9–10.3)
Chloride: 99 mmol/L (ref 98–111)
Creatinine, Ser: 1.02 mg/dL — ABNORMAL HIGH (ref 0.44–1.00)
GFR, Estimated: 58 mL/min — ABNORMAL LOW (ref >60)
Glucose, Bld: 100 mg/dL — ABNORMAL HIGH (ref 70–99)
Potassium: 4.4 mmol/L (ref 3.5–5.1)
Sodium: 132 mmol/L — ABNORMAL LOW (ref 135–145)

## 2022-02-23 LAB — CBC
HCT: 32.5 % — ABNORMAL LOW (ref 36.0–46.0)
Hemoglobin: 11.2 g/dL — ABNORMAL LOW (ref 12.0–15.0)
MCH: 34.5 pg — ABNORMAL HIGH (ref 26.0–34.0)
MCHC: 34.5 g/dL (ref 30.0–36.0)
MCV: 100 fL (ref 80.0–100.0)
Platelets: 294 10*3/uL (ref 150–400)
RBC: 3.25 MIL/uL — ABNORMAL LOW (ref 3.87–5.11)
RDW: 12.3 % (ref 11.5–15.5)
WBC: 5.1 10*3/uL (ref 4.0–10.5)
nRBC: 0 % (ref 0.0–0.2)

## 2022-02-23 SURGERY — REPAIR, HERNIA, INGUINAL, LAPAROSCOPIC
Anesthesia: General | Site: Abdomen | Laterality: Bilateral

## 2022-02-23 MED ORDER — CHLORHEXIDINE GLUCONATE 0.12 % MT SOLN
OROMUCOSAL | Status: AC
  Administered 2022-02-23: 15 mL via OROMUCOSAL
  Filled 2022-02-23: qty 15

## 2022-02-23 MED ORDER — HYDROMORPHONE HCL 1 MG/ML IJ SOLN
0.2500 mg | INTRAMUSCULAR | Status: DC | PRN
  Administered 2022-02-23: 0.5 mg via INTRAVENOUS
  Administered 2022-02-23: 0.25 mg via INTRAVENOUS

## 2022-02-23 MED ORDER — ROCURONIUM BROMIDE 10 MG/ML (PF) SYRINGE
PREFILLED_SYRINGE | INTRAVENOUS | Status: DC | PRN
  Administered 2022-02-23: 40 mg via INTRAVENOUS
  Administered 2022-02-23 (×3): 10 mg via INTRAVENOUS

## 2022-02-23 MED ORDER — HYDROMORPHONE HCL 1 MG/ML IJ SOLN
INTRAMUSCULAR | Status: AC
  Filled 2022-02-23: qty 1

## 2022-02-23 MED ORDER — DEXAMETHASONE SODIUM PHOSPHATE 10 MG/ML IJ SOLN
INTRAMUSCULAR | Status: DC | PRN
  Administered 2022-02-23: 4 mg via INTRAVENOUS

## 2022-02-23 MED ORDER — PROCHLORPERAZINE EDISYLATE 10 MG/2ML IJ SOLN
10.0000 mg | Freq: Four times a day (QID) | INTRAMUSCULAR | Status: DC | PRN
  Filled 2022-02-23: qty 2

## 2022-02-23 MED ORDER — PHENYLEPHRINE 80 MCG/ML (10ML) SYRINGE FOR IV PUSH (FOR BLOOD PRESSURE SUPPORT)
PREFILLED_SYRINGE | INTRAVENOUS | Status: DC | PRN
  Administered 2022-02-23: 80 ug via INTRAVENOUS

## 2022-02-23 MED ORDER — HYDRALAZINE HCL 20 MG/ML IJ SOLN: 5.0000 mg | INTRAMUSCULAR | Status: DC | PRN

## 2022-02-23 MED ORDER — SUGAMMADEX SODIUM 200 MG/2ML IV SOLN
INTRAVENOUS | Status: DC | PRN
  Administered 2022-02-23: 200 mg via INTRAVENOUS

## 2022-02-23 MED ORDER — ORAL CARE MOUTH RINSE: 15.0000 mL | Freq: Once | OROMUCOSAL | Status: AC

## 2022-02-23 MED ORDER — FENTANYL CITRATE (PF) 250 MCG/5ML IJ SOLN
INTRAMUSCULAR | Status: AC
  Filled 2022-02-23: qty 5

## 2022-02-23 MED ORDER — ONDANSETRON HCL 4 MG/2ML IJ SOLN
INTRAMUSCULAR | Status: DC | PRN
  Administered 2022-02-23: 4 mg via INTRAVENOUS

## 2022-02-23 MED ORDER — OXYCODONE HCL 5 MG PO TABS: 5.0000 mg | ORAL_TABLET | Freq: Once | ORAL | Status: DC | PRN

## 2022-02-23 MED ORDER — BUPIVACAINE-EPINEPHRINE (PF) 0.25% -1:200000 IJ SOLN
INTRAMUSCULAR | Status: AC
  Filled 2022-02-23: qty 30

## 2022-02-23 MED ORDER — BUPIVACAINE-EPINEPHRINE 0.25% -1:200000 IJ SOLN
INTRAMUSCULAR | Status: DC | PRN
  Administered 2022-02-23: 5 mL

## 2022-02-23 MED ORDER — FENTANYL CITRATE (PF) 250 MCG/5ML IJ SOLN
INTRAMUSCULAR | Status: DC | PRN
  Administered 2022-02-23 (×6): 50 ug via INTRAVENOUS

## 2022-02-23 MED ORDER — POLYVINYL ALCOHOL 1.4 % OP SOLN
2.0000 [drp] | OPHTHALMIC | Status: DC | PRN
  Administered 2022-02-23: 2 [drp] via OPHTHALMIC
  Filled 2022-02-23: qty 15

## 2022-02-23 MED ORDER — OXYCODONE HCL 5 MG/5ML PO SOLN: 5.0000 mg | Freq: Once | ORAL | Status: DC | PRN

## 2022-02-23 MED ORDER — LIDOCAINE 2% (20 MG/ML) 5 ML SYRINGE
INTRAMUSCULAR | Status: DC | PRN
  Administered 2022-02-23: 100 mg via INTRAVENOUS

## 2022-02-23 MED ORDER — PHENYLEPHRINE HCL-NACL 20-0.9 MG/250ML-% IV SOLN
INTRAVENOUS | Status: DC | PRN
  Administered 2022-02-23: 25 ug/min via INTRAVENOUS

## 2022-02-23 MED ORDER — ONDANSETRON HCL 4 MG/2ML IJ SOLN
4.0000 mg | Freq: Once | INTRAMUSCULAR | Status: DC | PRN
Start: 2022-02-23 — End: 2022-02-23

## 2022-02-23 MED ORDER — ACETAMINOPHEN 500 MG PO TABS
1000.0000 mg | ORAL_TABLET | Freq: Once | ORAL | Status: AC
  Administered 2022-02-23: 1000 mg via ORAL
  Filled 2022-02-23: qty 2

## 2022-02-23 MED ORDER — DEXMEDETOMIDINE (PRECEDEX) IN NS 20 MCG/5ML (4 MCG/ML) IV SYRINGE
PREFILLED_SYRINGE | INTRAVENOUS | Status: DC | PRN
  Administered 2022-02-23: 12 ug via INTRAVENOUS
  Administered 2022-02-23: 8 ug via INTRAVENOUS
  Administered 2022-02-23: 4 ug via INTRAVENOUS
  Administered 2022-02-23: 8 ug via INTRAVENOUS

## 2022-02-23 MED ORDER — LACTATED RINGERS IV SOLN: INTRAVENOUS | Status: DC

## 2022-02-23 MED ORDER — AMISULPRIDE (ANTIEMETIC) 5 MG/2ML IV SOLN: 10.0000 mg | Freq: Once | INTRAVENOUS | Status: DC | PRN

## 2022-02-23 MED ORDER — OXYCODONE HCL 5 MG PO TABS
5.0000 mg | ORAL_TABLET | ORAL | Status: DC | PRN
  Administered 2022-02-23 – 2022-02-24 (×3): 5 mg via ORAL
  Filled 2022-02-23 (×3): qty 1

## 2022-02-23 MED ORDER — PROPOFOL 10 MG/ML IV BOLUS
INTRAVENOUS | Status: DC | PRN
  Administered 2022-02-23: 50 mg via INTRAVENOUS
  Administered 2022-02-23: 20 mg via INTRAVENOUS
  Administered 2022-02-23: 100 mg via INTRAVENOUS

## 2022-02-23 MED ORDER — CHLORHEXIDINE GLUCONATE 0.12 % MT SOLN: 15.0000 mL | Freq: Once | OROMUCOSAL | Status: AC

## 2022-02-23 MED ORDER — CEFAZOLIN SODIUM-DEXTROSE 2-3 GM-%(50ML) IV SOLR
INTRAVENOUS | Status: DC | PRN
  Administered 2022-02-23: 2 g via INTRAVENOUS

## 2022-02-23 SURGICAL SUPPLY — 45 items
BAG COUNTER SPONGE SURGICOUNT (BAG) ×2 IMPLANT
BLADE CLIPPER SURG (BLADE) IMPLANT
CANISTER SUCT 3000ML PPV (MISCELLANEOUS) IMPLANT
CHLORAPREP W/TINT 26 (MISCELLANEOUS) ×2 IMPLANT
COVER SURGICAL LIGHT HANDLE (MISCELLANEOUS) ×2 IMPLANT
DERMABOND ADHESIVE PROPEN (GAUZE/BANDAGES/DRESSINGS) ×2
DERMABOND ADVANCED (GAUZE/BANDAGES/DRESSINGS) ×2
DERMABOND ADVANCED .7 DNX12 (GAUZE/BANDAGES/DRESSINGS) ×2 IMPLANT
DERMABOND ADVANCED .7 DNX6 (GAUZE/BANDAGES/DRESSINGS) ×1 IMPLANT
DEVICE SECURE STRAP 25 ABSORB (INSTRUMENTS) ×2 IMPLANT
DRAIN PENROSE 1/2X12 LTX STRL (WOUND CARE) IMPLANT
DRAPE LAPAROTOMY TRNSV 102X78 (DRAPES) ×2 IMPLANT
ELECT REM PT RETURN 9FT ADLT (ELECTROSURGICAL) ×2
ELECTRODE REM PT RTRN 9FT ADLT (ELECTROSURGICAL) ×2 IMPLANT
GLOVE BIO SURGEON STRL SZ8 (GLOVE) ×2 IMPLANT
GLOVE BIOGEL PI IND STRL 8 (GLOVE) ×2 IMPLANT
GOWN STRL REUS W/ TWL LRG LVL3 (GOWN DISPOSABLE) ×2 IMPLANT
GOWN STRL REUS W/ TWL XL LVL3 (GOWN DISPOSABLE) ×2 IMPLANT
GOWN STRL REUS W/TWL LRG LVL3 (GOWN DISPOSABLE) ×2
GOWN STRL REUS W/TWL XL LVL3 (GOWN DISPOSABLE) ×2
KIT BASIN OR (CUSTOM PROCEDURE TRAY) ×2 IMPLANT
KIT TURNOVER KIT B (KITS) ×2 IMPLANT
MESH 3DMAX 3X5 LT MED (Mesh General) ×1 IMPLANT
MESH 3DMAX 3X5 RT MED (Mesh General) ×1 IMPLANT
NDL HYPO 25GX1X1/2 BEV (NEEDLE) ×1 IMPLANT
NEEDLE HYPO 25GX1X1/2 BEV (NEEDLE) ×2 IMPLANT
NS IRRIG 1000ML POUR BTL (IV SOLUTION) ×2 IMPLANT
PACK GENERAL/GYN (CUSTOM PROCEDURE TRAY) ×2 IMPLANT
PAD ARMBOARD 7.5X6 YLW CONV (MISCELLANEOUS) ×2 IMPLANT
PENCIL SMOKE EVACUATOR (MISCELLANEOUS) ×2 IMPLANT
SLEEVE Z-THREAD 5X100MM (TROCAR) ×1 IMPLANT
SUT MNCRL AB 4-0 PS2 18 (SUTURE) ×2 IMPLANT
SUT NOVA NAB DX-16 0-1 5-0 T12 (SUTURE) ×2 IMPLANT
SUT SILK 2 0 SH (SUTURE) IMPLANT
SUT VIC AB 0 CT1 27 (SUTURE)
SUT VIC AB 0 CT1 27XBRD ANBCTR (SUTURE) IMPLANT
SUT VIC AB 2-0 SH 27 (SUTURE) ×2
SUT VIC AB 2-0 SH 27X BRD (SUTURE) ×2 IMPLANT
SUT VIC AB 3-0 SH 18 (SUTURE) ×2 IMPLANT
SUT VICRYL AB 3 0 TIES (SUTURE) ×2 IMPLANT
SYR CONTROL 10ML LL (SYRINGE) ×2 IMPLANT
TOWEL GREEN STERILE (TOWEL DISPOSABLE) ×2 IMPLANT
TOWEL GREEN STERILE FF (TOWEL DISPOSABLE) ×2 IMPLANT
TROCAR BALLN 12MMX100 BLUNT (TROCAR) ×1 IMPLANT
TROCAR Z-THREAD OPTICAL 5X100M (TROCAR) ×1 IMPLANT

## 2022-02-23 NOTE — Anesthesia Preprocedure Evaluation (Addendum)
Anesthesia Evaluation  Patient identified by MRN, date of birth, ID band Patient awake    Reviewed: Allergy & Precautions, NPO status , Patient's Chart, lab work & pertinent test results  History of Anesthesia Complications (+) PONV and history of anesthetic complications  Airway Mallampati: II  TM Distance: >3 FB Neck ROM: Full    Dental no notable dental hx.    Pulmonary neg pulmonary ROS, former smoker,    Pulmonary exam normal breath sounds clear to auscultation       Cardiovascular hypertension (190/84 preop), Pt. on medications Normal cardiovascular exam Rhythm:Regular Rate:Normal     Neuro/Psych negative neurological ROS  negative psych ROS   GI/Hepatic negative GI ROS, Neg liver ROS,   Endo/Other  Hypothyroidism   Renal/GU negative Renal ROS  negative genitourinary   Musculoskeletal  (+) Arthritis , Osteoarthritis,    Abdominal   Peds negative pediatric ROS (+)  Hematology  (+) Blood dyscrasia, anemia , Hb 11.2, plt 294   Anesthesia Other Findings Left inguinal hernia   Reproductive/Obstetrics negative OB ROS                            Anesthesia Physical Anesthesia Plan  ASA: 3  Anesthesia Plan: General   Post-op Pain Management: Tylenol PO (pre-op)*   Induction: Intravenous  PONV Risk Score and Plan: 4 or greater and Ondansetron, Dexamethasone, Treatment may vary due to age or medical condition, Midazolam and Diphenhydramine  Airway Management Planned: Oral ETT  Additional Equipment: None  Intra-op Plan:   Post-operative Plan: Extubation in OR  Informed Consent: I have reviewed the patients History and Physical, chart, labs and discussed the procedure including the risks, benefits and alternatives for the proposed anesthesia with the patient or authorized representative who has indicated his/her understanding and acceptance.     Dental advisory given  Plan  Discussed with: CRNA  Anesthesia Plan Comments:        Anesthesia Quick Evaluation

## 2022-02-23 NOTE — Op Note (Signed)
Preoperative diagnosis: Reduced left femoral hernia  Postoperative diagnosis: Reduced left femoral hernia, left inguinal hernia direct, right inguinal hernia indirect  Procedure: Laparoscopic transabdominal repair of left femoral hernia with mesh, left inguinal hernia with mesh, and right inguinal hernia with mesh  Surgeon: Erroll Luna, MD  Anesthesia: General with 0.25% Marcaine plain  EBL: Minimal  Specimen: None  Findings: Reduced left femoral hernia, left inguinal hernia and right inguinal hernia stated above.  Drains: None  Indications for procedure: The patient is a 71 year old female admitted yesterday to the emergency room with a left femoral hernia.  This was reduced in the ED but she stayed to have this repaired since she was still having pain.  Risks and benefits of repair discussed.  Laparoscopic and open techniques discussed and rationale for each.  The use of mesh and complications of mesh use discussed.The risk of hernia repair include bleeding,  Infection,   Recurrence of the hernia,  Mesh use, chronic pain,  Organ injury,  Bowel injury,  Bladder injury,   nerve injury with numbness around the incision,  Death,  and worsening of preexisting  medical problems.  The alternatives to surgery have been discussed as well..  Long term expectations of both operative and non operative treatments have been discussed.   The patient agrees to proceed.   Description of procedure: The patient was met in the holding area and questions were answered.  She was then taken back to the operating room.  She was placed upon the OR table.  After induction of general esthesia a Foley catheter was placed.  She was then prepped and draped in sterile fashion and a timeout performed.  Of note the left arm was tucked for patient preference.  After timeout was verified antibiotics were verified given, a 1 cm incision was made just above the umbilicus.  Dissection was carried down the fascia the midline was  opened.  The abdominal cavity was entered under direct vision.  A pursestring suture of 0 Vicryl was placed.  A 12 mm balloon catheter was placed and pneumoperitoneum was created to 15 mmHg of CO2 pressure.  2 additional ports were placed which were 5 mm in the right lower left lower quadrant respectively all under direct vision.  Cautery was used to open the peritoneum to create flaps to gain access to both inguinal regions.  Of note she did have bilateral hernias which were noted with laparoscopic evaluation.  The left had a femoral hernia as well as an inguinal hernia in the right she had an inguinal hernia.  These were going to be repaired in a similar fashion to when he had and did so.  The prerenal space was opened as stated above.  Dissection was carried down to the pubis.  I then opened up the left inguinal canal region.  The left round ligament was divided to gain better visualization.  The hernia was noted to be a direct inguinal hernia on the left as well as femoral hernia which were both small.  Nothing was incarcerated or strangulated.  In similar fashion the preperitoneal space was developed on the right.  She had a small direct hernia noted on the right side.  No evidence of femoral hernia though.  3D Bard mesh was used medium size.  This was placed on the left to position it against the pubis.  It extended below that to cover both the femoral canal in the inguinal canal defect widely.  Secure strap was used to keep  the mesh in place and 3 tacks were placed in the left side.  The mesh tucked  right nicely underneath the pubis to have good coverage.  There is no undue tension.  This is well away from the bladder.  The right side a large 3D Bard mesh was used.  It was situated in a similar fashion against the pubic symphysis under the pubic tubercle.  It was tucked nicely there is well.  3 tacks were placed superiorly on this as well.  This help [place] maneuver the mesh and keep it in position.  This cover  both the inguinal canal as well as the femoral canal well without any undue tension or compression of the femoral vessels either on either side.  We then pulled the peritoneal flap back up and tacked it is closed the space.  There is no evidence of bleeding.  The perineal closure was tight with no gaps.  I then inspected the bowel and saw no no evidence of bowel injury or other intra-abdominal injury from this.  I then removed, portion of the CO2 to escape.  Fascia closed with 0 Vicryl.  4 Monocryl used to close skin.  All counts found to be correct.  The patient was awoke extubated taken to recovery in satisfactory condition.

## 2022-02-23 NOTE — Interval H&P Note (Signed)
History and Physical Interval Note:  02/23/2022 8:54 AM  Megan Stokes  has presented today for surgery, with the diagnosis of LIH.  The various methods of treatment have been discussed with the patient and family. After consideration of risks, benefits and other options for treatment, the patient has consented to  Procedure(s): HERNIA REPAIR INGUINAL ADULT (Left) as a surgical intervention.  The patient's history has been reviewed, patient examined, no change in status, stable for surgery.  I have reviewed the patient's chart and labs.  Questions were answered to the patient's satisfaction.   Will attempt laparoscopic approach with mesh if able  If not , discussed open repair with mesh  The risk of hernia repair include bleeding,  Infection,   Recurrence of the hernia,  Mesh use, chronic pain,  Organ injury,  Bowel injury,  Bladder injury,   nerve injury with numbness around the incision,  Death,  and worsening of preexisting  medical problems.  The alternatives to surgery have been discussed as well..  Long term expectations of both operative and non operative treatments have been discussed.   The patient agrees to proceed.  [place]

## 2022-02-23 NOTE — Anesthesia Procedure Notes (Signed)
Procedure Name: Intubation Date/Time: 02/23/2022 9:29 AM  Performed by: Wilburn Cornelia, CRNAPre-anesthesia Checklist: Patient identified, Emergency Drugs available, Suction available, Patient being monitored and Timeout performed Patient Re-evaluated:Patient Re-evaluated prior to induction Oxygen Delivery Method: Circle system utilized Preoxygenation: Pre-oxygenation with 100% oxygen Induction Type: IV induction Ventilation: Mask ventilation without difficulty Laryngoscope Size: Mac and 3 Grade View: Grade I Tube type: Oral Tube size: 7.0 mm Airway Equipment and Method: Stylet Placement Confirmation: ETT inserted through vocal cords under direct vision, positive ETCO2, CO2 detector and breath sounds checked- equal and bilateral Secured at: 21 cm Dental Injury: Teeth and Oropharynx as per pre-operative assessment

## 2022-02-23 NOTE — Plan of Care (Signed)
NPO at [facility] for surgery today. BP this morning is elevated, asymptomatic, early dose of lisinopril given.  Problem: Pain Managment: Goal: General experience of comfort will improve Outcome: Progressing

## 2022-02-23 NOTE — Anesthesia Postprocedure Evaluation (Signed)
Anesthesia Post Note  Patient: Megan Stokes  Procedure(s) Performed: LAPAROSCOPIC INGUINAL HERNIA (Bilateral: Abdomen)     Patient location during evaluation: PACU Anesthesia Type: General Level of consciousness: awake and alert, oriented and patient cooperative Pain management: pain level controlled Vital Signs Assessment: post-procedure vital signs reviewed and stable Respiratory status: spontaneous breathing, nonlabored ventilation and respiratory function stable Cardiovascular status: blood pressure returned to baseline and stable Postop Assessment: no apparent nausea or vomiting Anesthetic complications: no   No notable events documented.  Last Vitals:  Vitals:   02/23/22 1245 02/23/22 1314  BP: (!) 152/73 (!) 156/78  Pulse: (!) 58 (!) 52  Resp: 13 16  Temp:  36.7 C  SpO2: 98% 96%    Last Pain:  Vitals:   02/23/22 1314  TempSrc: Oral  PainSc: [place]

## 2022-02-23 NOTE — Transfer of Care (Signed)
Immediate Anesthesia Transfer of Care Note  Patient: Megan Stokes  Procedure(s) Performed: LAPAROSCOPIC INGUINAL HERNIA (Bilateral: Abdomen)  Patient Location: PACU  Anesthesia Type:General  Level of Consciousness: awake, alert  and oriented  Airway & Oxygen Therapy: Patient Spontanous Breathing and Patient connected to nasal cannula oxygen  Post-op Assessment: Report given to RN and Post -op Vital signs reviewed and stable  Post vital signs: Reviewed and stable  Last Vitals:  Vitals Value Taken Time  BP 124/54 02/23/22 1146  Temp    Pulse 59 02/23/22 1150  Resp 17 02/23/22 1150  SpO2 100 % 02/23/22 1150  Vitals shown include unvalidated device data.  Last Pain:  Vitals:   02/23/22 0739  TempSrc: Oral  PainSc:       Patients Stated Pain Goal: 0 (56/72/09 1980)  Complications: No notable events documented.

## 2022-02-24 ENCOUNTER — Encounter (HOSPITAL_COMMUNITY): Payer: Self-pay | Admitting: Surgery

## 2022-02-24 LAB — BASIC METABOLIC PANEL
Anion gap: 9 (ref 5–15)
BUN: 9 mg/dL (ref 8–23)
CO2: 23 mmol/L (ref 22–32)
Calcium: 8.2 mg/dL — ABNORMAL LOW (ref 8.9–10.3)
Chloride: 99 mmol/L (ref 98–111)
Creatinine, Ser: 0.73 mg/dL (ref 0.44–1.00)
GFR, Estimated: >60 mL/min (ref >60)
Glucose, Bld: 98 mg/dL (ref 70–99)
Potassium: 4 mmol/L (ref 3.5–5.1)
Sodium: 131 mmol/L — ABNORMAL LOW (ref 135–145)

## 2022-02-24 MED ORDER — TRAMADOL HCL 50 MG PO TABS: 50.0000 mg | ORAL_TABLET | Freq: Four times a day (QID) | ORAL | 0 refills | Status: AC | PRN

## 2022-02-24 MED ORDER — POLYETHYLENE GLYCOL 3350 17 G PO PACK: 17.0000 g | PACK | Freq: Every day | ORAL | 0 refills | Status: DC | PRN

## 2022-02-24 MED ORDER — DOCUSATE SODIUM 100 MG PO CAPS: 100.0000 mg | ORAL_CAPSULE | Freq: Two times a day (BID) | ORAL | 0 refills | Status: DC | PRN

## 2022-02-24 MED ORDER — TRAMADOL HCL 50 MG PO TABS
50.0000 mg | ORAL_TABLET | Freq: Four times a day (QID) | ORAL | Status: DC | PRN
  Administered 2022-02-24 (×2): 50 mg via ORAL
  Filled 2022-02-24 (×2): qty 1

## 2022-02-24 MED ORDER — POLYETHYLENE GLYCOL 3350 17 G PO PACK
17.0000 g | PACK | Freq: Two times a day (BID) | ORAL | Status: DC
  Administered 2022-02-24: 17 g via ORAL

## 2022-02-24 NOTE — Discharge Instructions (Signed)
[facility] _______[facility]  UMBILICAL OR INGUINAL HERNIA REPAIR: POST OP INSTRUCTIONS  Always review your discharge instruction sheet given to you by the facility where your surgery was performed. IF YOU HAVE DISABILITY OR FAMILY LEAVE FORMS, YOU MUST BRING THEM TO THE OFFICE FOR PROCESSING.   DO NOT GIVE THEM TO YOUR DOCTOR.  1. A  prescription for pain medication may be given to you upon discharge.  Take your pain medication as prescribed, if needed.  If narcotic pain medicine is not needed, then you may take acetaminophen (Tylenol) as needed.  2. Take your usually prescribed medications unless otherwise directed. If you need a refill on your pain medication, please contact your pharmacy.  They will contact our office to request authorization. Prescriptions will not be filled after 5 pm or on week-ends. 3. You should follow a light diet the first 24 hours after arrival home, such as soup and crackers, etc.  Be sure to include lots of fluids daily.  Resume your normal diet the day after surgery. 4.Most patients will experience some swelling and bruising around the umbilicus or in the groin and scrotum.  Ice packs and reclining will help.  Swelling and bruising can take several days to resolve.  6. It is common to experience some constipation if taking pain medication after surgery.  Increasing fluid intake and taking a stool softener (such as Colace) will usually help or prevent this problem from occurring.  A mild laxative (Milk of Magnesia or Miralax) should be taken according to package directions if there are no bowel movements after 48 hours. 7. Unless discharge instructions indicate otherwise, you may remove your bandages 24-48 hours after surgery, and you may shower at that time.  You may have steri-strips (small skin tapes) in place directly over the incision.  These strips should be left on the skin for 7-10 days.  If your surgeon used skin glue on the incision, you may shower in  24 hours.  The glue will flake off over the next 2-3 weeks.  Any sutures or staples will be removed at the office during your follow-up visit. 8. ACTIVITIES:  You may resume regular (light) daily activities beginning the next day--such as daily self-care, walking, climbing stairs--gradually increasing activities as tolerated.  You may have sexual intercourse when it is comfortable.  Refrain from any heavy lifting or straining until approved by your doctor. Do not lift ?5lbs for 6 weeks after surgery unless told otherwise by your doctor/at follow up.   a.You may drive when you are no longer taking prescription pain medication, you can comfortably wear a seatbelt, and you can safely maneuver your car and apply brakes.   9.You should see your doctor in the office for a follow-up appointment approximately 2-3 weeks after your surgery.  Make sure that you call for this appointment within a day or two after you arrive home to insure a convenient appointment time.   WHEN TO CALL YOUR DOCTOR: Fever over 101.0 Inability to urinate Nausea and/or vomiting Extreme swelling or bruising Continued bleeding from incision. Increased pain, redness, or drainage from the incision  The clinic staff is available to answer your questions during regular business hours.  Please don't hesitate to call and ask to speak to one of the nurses for clinical concerns.  If you have a medical emergency, go to the nearest emergency room or call 911.  A surgeon from [facility] is always on call at the hospital   [address], [place], [place], [place]  [address] ?  [address], [place], [place]   [address] [phone] ? [phone] ? FAX [phone] Web site: [email or web address]

## 2022-02-24 NOTE — Progress Notes (Signed)
1 Day Post-Op  Subjective: CC: Patient reports she was nauseated after surgery yesterday. Vomited a few hours after getting back to the floor. Notes oxy makes her nauseated as well and is having to take zofran with it. Has tried norco/vicodin in the past which also made her nauseated. Has never tried Tramadol. Nausea is better this am. Able to tolerate some water and crackers without vomiting. Some soreness around her incisions that is well controlled currently. Overall, abdominal pain feels much better from before surgery. Passing some flatus. No bm. Voiding. Oob to bathroom/around room yesterday.   Objective: Vital signs in last 24 hours: Temp:  [97.4 F (36.3 C)-99.2 F (37.3 C)] 98 F (36.7 C) (09/08 0438) Pulse Rate:  [52-70] 57 (09/08 0438) Resp:  [12-17] 16 (09/08 0438) BP: (124-190)/(54-89) 164/69 (09/08 0438) SpO2:  [95 %-100 %] 96 % (09/08 0438) Weight:  [48.5 kg] 48.5 kg (09/07 0838) Last BM Date : 02/22/22  Intake/Output from previous day: 09/07 0701 - 09/08 0700 In: 3151.9 [P.O.:240; I.V.:2911.9] Out: 275 [Urine:250; Blood:25] Intake/Output this shift: No intake/output data recorded.  PE: Gen:  Alert, NAD, pleasant Card:  RRR Pulm:  CTAB, no W/R/R, effort normal Abd: Soft, ND, appropriately tender around incisions - otherwise NT, +BS, incisions with glue intact appears well and are without drainage, bleeding, or signs of infection. No evidence of recurrent hernia Ext:  No LE edema  Psych: A&Ox3  Skin: no rashes noted, warm and dry  Lab Results:  Recent Labs    02/22/22 0544 02/23/22 0016  WBC 3.9* 5.1  HGB 9.2* 11.2*  HCT 26.3* 32.5*  PLT 222 294   BMET Recent Labs    02/22/22 0544 02/23/22 0016  NA 134* 132*  K 4.1 4.4  CL 105 99  CO2 23 25  GLUCOSE 88 100*  BUN 11 18  CREATININE 0.63 1.02*  CALCIUM 8.0* 9.0   PT/INR No results for input(s): "LABPROT", "INR" in the last 72 hours. CMP     Component Value Date/Time   NA 132 (L)  02/23/2022 0016   K 4.4 02/23/2022 0016   CL 99 02/23/2022 0016   CO2 25 02/23/2022 0016   GLUCOSE 100 (H) 02/23/2022 0016   BUN 18 02/23/2022 0016   CREATININE 1.02 (H) 02/23/2022 0016   CALCIUM 9.0 02/23/2022 0016   PROT 7.3 02/21/2022 1908   ALBUMIN 4.2 02/21/2022 1908   AST 19 02/21/2022 1908   ALT 14 02/21/2022 1908   ALKPHOS 56 02/21/2022 1908   BILITOT 0.4 02/21/2022 1908   GFRNONAA 58 (L) 02/23/2022 0016   GFRAA >90 02/13/2014 0711   Lipase     Component Value Date/Time   LIPASE 43 02/21/2022 1908    Studies/Results: No results found.  Anti-infectives: Anti-infectives (From admission, onward)    None        Assessment/Plan POD 1 s/p Laparoscopic transabdominal repair of left femoral hernia with mesh, left inguinal hernia with mesh, and right inguinal hernia with mesh for Reduced left femoral hernia, left inguinal hernia direct, right inguinal hernia indirect by Dr. Brantley Stage on 02/23/22 - Advance diet - Switch oxy to tramadol to see if that helps with nausea - Mobilize, will see if mobility tech can work with her - recheck bmp, AKI on labs yesterday - Will recheck this pm for possilbe d/c. Discussed discharge instructions, restrictions and return/call back precautions with patient and her son  FEN - Regular diet, IVF VTE - SCDs, Lovenox (hgb stable) ID - None  currently Foley - None, voiding  HTN - home Lisinopril  Hypothyroidism - home Levothyroxine  AKI - Voiding. On IVF. Repeat pending. Hold nsaids.    LOS: 0 days    Jillyn Ledger , [facility] 02/24/2022, 8:02 AM Please see Amion for pager number during day hours 7:00am-4:30pm

## 2022-02-24 NOTE — Progress Notes (Signed)
Mobility Specialist - Progress Note   02/24/22 1002  Mobility  Activity Ambulated independently in hallway  Level of Assistance Independent  Assistive Device None  Distance Ambulated (ft) 1100 ft  Activity Response Tolerated well  $Mobility charge 1 Mobility    Pt received in bed agreeable to mobility. No complaints throughout. Left EOB w/ call bell in reach and all needs met.   Paulla Dolly Mobility Specialist

## 2022-02-27 ENCOUNTER — Telehealth: Payer: Self-pay | Admitting: Family Medicine

## 2022-02-27 ENCOUNTER — Telehealth: Payer: Self-pay

## 2022-02-27 NOTE — Telephone Encounter (Signed)
Per appt notes pt already has HFU scheduled with Dr Einar Pheasant 02/28/22 at 10:20 AM. Sending note to Dr Einar Pheasant and [facility] CMA.

## 2022-02-27 NOTE — Telephone Encounter (Signed)
Patient is referring to the hCTZ supplement.

## 2022-02-27 NOTE — Telephone Encounter (Signed)
Ok to try HCTZ tonight.   I will see her tomorrow

## 2022-02-27 NOTE — Telephone Encounter (Signed)
[place] Day - Client TELEPHONE ADVICE RECORD [facility] Patient Name: Megan Stokes Gender: Female DOB: [birth year] Age: 71 Y 2 M 5 D Return Phone Number: [phone] (Primary) Address: City/ State/ Zip: [place]  [address] Client [place] Day - Client Client Site [place] - Day Provider Waunita Schooner- MD Contact Type Call Who Is Calling Patient / Member / Family / Caregiver Call Type Triage / Clinical Relationship To Patient Self Return Phone Number [phone] (Primary) Chief Complaint BLOOD PRESSURE HIGH - Diastolic (bottom number) [phone] or greater Reason for Call Symptomatic / Request for [place] states her bp has been high it was at 150/115, 149/58, and 150/120. Translation No Nurse Assessment Nurse: Ronnald Ramp, RN, Miranda Date/Time (Eastern Time): 02/27/2022 12:59:44 PM Confirm and document reason for call. If symptomatic, describe symptoms. ---Caller states she was seen in the ED Tuesday afternoon for abdominal and chest pain. She was diagnosed with 3 hernias. She had surgery on Thursday and discharged on Friday. It was recommended she follow up with her PCP because her BP was very high in the hospital. She has a medication, HCTZ, that was previously prescribed my PCP in April. She never starting taking the medication. Her BP was 150/115. Does the patient have any new or worsening symptoms? ---Yes Will a triage be completed? ---Yes Related visit to physician within the last 2 weeks? ---Yes Does the PT have any chronic conditions? (i.e. diabetes, asthma, this includes High risk factors for pregnancy, etc.) ---Yes List chronic conditions. ---HTN, High Cholesterol, Thyroid Is this a behavioral health or substance abuse call? ---No Guidelines Guideline Title Affirmed Question Affirmed Notes Nurse Date/Time (Eastern Time) Blood Pressure  - High Systolic BP >= 163 OR Diastolic >= 846 Jones, RN, Miranda 02/27/2022 1:05:21 PM PLEASE NOTE: All timestamps contained within this report are represented as [place] Standard Time. CONFIDENTIALTY NOTICE: This fax transmission is intended only for the addressee. It contains information that is legally privileged, confidential or otherwise protected from use or disclosure. If you are not the intended recipient, you are strictly prohibited from reviewing, disclosing, copying using or disseminating any of this information or taking any action in reliance on or regarding this information. If you have received this fax in error, please notify us immediately by telephone so that we can arrange for its return to [place]. Phone: [phone], Toll-Free: [phone], Fax: [phone] Page: 2 of 2 Call Id: [id number] [place]. Time Eilene Ghazi Time) Disposition Final User 02/27/2022 12:58:15 PM Send to Urgent [place] Else 02/27/2022 1:11:01 PM See PCP within 24 Hours Yes Ronnald Ramp RN, Miranda Final Disposition 02/27/2022 1:11:01 PM See PCP within 24 Hours Yes Ronnald Ramp, RN, Judge Stall Disagree/Comply Comply Caller Understands Yes PreDisposition Call Doctor Care Advice Given Per Guideline SEE PCP WITHIN 24 HOURS: * IF OFFICE WILL BE OPEN: You need to be examined within the next 24 hours. Call your doctor (or NP/PA) when the office opens and make an appointment. CALL BACK IF: * Weakness or numbness of the face, arm or leg on one side of the body occurs * Difficulty walking, difficulty talking, or severe headache occurs * Chest pain or difficulty breathing occurs * You become worse CARE ADVICE given per High Blood Pressure (Adult) guideline. Comments User: Leverne Humbles, RN Date/Time Eilene Ghazi Time): 02/27/2022 1:11:37 PM Pt has an appt scheduled for tomorrow morning. Referrals REFERRED TO PCP OFFIC

## 2022-02-27 NOTE — Telephone Encounter (Signed)
Noted will see tomorrow 

## 2022-02-27 NOTE — Telephone Encounter (Signed)
Pt.notified

## 2022-02-27 NOTE — Telephone Encounter (Signed)
Patient scheduled for tomorrow 02/28/22. She would still like to know if she should take the supplement today? She would like a phone call.

## 2022-02-27 NOTE — Telephone Encounter (Signed)
Patient called and stated that when she was released from the hospital they said her blood pressure was high and wanted to know if she can take a supplement with her lisinopril (ZESTRIL) 20 MG tablet. Call back number [phone]

## 2022-02-28 ENCOUNTER — Ambulatory Visit: Payer: Medicare PPO | Admitting: Family Medicine

## 2022-02-28 VITALS — BP 110/60 | HR 78 | Temp 97.4°F | Ht 60.75 in | Wt 106.5 lb

## 2022-02-28 DIAGNOSIS — D649 Anemia, unspecified: Secondary | ICD-10-CM

## 2022-02-28 DIAGNOSIS — I10 Essential (primary) hypertension: Secondary | ICD-10-CM | POA: Diagnosis not present

## 2022-02-28 DIAGNOSIS — K5900 Constipation, unspecified: Secondary | ICD-10-CM

## 2022-02-28 DIAGNOSIS — K4090 Unilateral inguinal hernia, without obstruction or gangrene, not specified as recurrent: Secondary | ICD-10-CM

## 2022-02-28 DIAGNOSIS — E871 Hypo-osmolality and hyponatremia: Secondary | ICD-10-CM | POA: Diagnosis not present

## 2022-02-28 LAB — BASIC METABOLIC PANEL
BUN: 11 mg/dL (ref 6–23)
CO2: 28 mEq/L (ref 19–32)
Calcium: 9 mg/dL (ref 8.4–10.5)
Chloride: 94 mEq/L — ABNORMAL LOW (ref 96–112)
Creatinine, Ser: 0.7 mg/dL (ref 0.40–1.20)
GFR: 85.94 mL/min (ref >60.00)
Glucose, Bld: 129 mg/dL — ABNORMAL HIGH (ref 70–99)
Potassium: 4.3 mEq/L (ref 3.5–5.1)
Sodium: 131 mEq/L — ABNORMAL LOW (ref 135–145)

## 2022-02-28 LAB — CBC
HCT: 29.1 % — ABNORMAL LOW (ref 36.0–46.0)
Hemoglobin: 10.3 g/dL — ABNORMAL LOW (ref 12.0–15.0)
MCHC: 35.2 g/dL (ref 30.0–36.0)
MCV: 100.8 fl — ABNORMAL HIGH (ref 78.0–100.0)
Platelets: 349 10*3/uL (ref 150.0–400.0)
RBC: 2.89 Mil/uL — ABNORMAL LOW (ref 3.87–5.11)
RDW: 12.7 % (ref 11.5–15.5)
WBC: 4.2 10*3/uL (ref 4.0–10.5)

## 2022-02-28 NOTE — Assessment & Plan Note (Signed)
Present in the setting of recent surgery. Recheck today

## 2022-02-28 NOTE — Patient Instructions (Signed)
High Blood pressure - Continue lisinopril and hydrochlorothiazide -- update with labs - check blood pressure and update in 2 weeks   Constipation - take miralax three times today - continue stool softener - if no improvement - take magnesium citrate tomorrow - if no improvement - recommend suppository - if no improvement update on Thursday or Friday - IF worsening abdominal pain or vomiting   Ok to continue tramadol as needed for pain

## 2022-02-28 NOTE — Progress Notes (Signed)
Subjective:     Megan Stokes is a 71 y.o. female presenting for Hospitalization Follow-up (NO BM for 1 week, despite taking miralax and stool softener. )     HPI  #HTN - took HCTZ and lisinopril this morning - bp at home on lisinopril along was 190/120 - recent hernia surgery - pain is improving - has not taken pain meds in 2 days - bp was normal before the pain started - occasionally 130/70  #Hernia repair - surgery post op on 9/28 - pain is improving - trying not to take medication - not sleeping well due to pain - has been avoiding due to side effects  #Constipation - taking miralax 1 dose daily - stool softener 2 a day - no bm x 1 week    Review of Systems  9/5-01/2022: Admission - painful hernia s/p repair. Elevated bp - lisinopril and start hctz. AKI  Social History   Tobacco Use  Smoking Status Former  Smokeless Tobacco Never        Objective:    BP Readings from Last 3 Encounters:  02/28/22 110/60  02/24/22 (!) 163/81  02/21/22 (!) 217/89   Wt Readings from Last 3 Encounters:  02/28/22 106 lb 8 oz (48.3 kg)  02/23/22 107 lb (48.5 kg)  02/14/22 107 lb 4 oz (48.6 kg)    BP 110/60   Pulse 78   Temp (!) 97.4 F (36.3 C) (Temporal)   Ht 5' 0.75" (1.543 m)   Wt 106 lb 8 oz (48.3 kg)   SpO2 96%   BMI 20.29 kg/m    Physical Exam Constitutional:      General: She is not in acute distress.    Appearance: She is well-developed. She is not diaphoretic.  HENT:     Right Ear: External ear normal.     Left Ear: External ear normal.     Nose: Nose normal.  Eyes:     Conjunctiva/sclera: Conjunctivae normal.  Cardiovascular:     Rate and Rhythm: Normal rate and regular rhythm.     Heart sounds: No murmur heard. Pulmonary:     Effort: Pulmonary effort is normal. No respiratory distress.     Breath sounds: Normal breath sounds. No wheezing or rales.  Abdominal:     General: Abdomen is flat. Bowel sounds are normal. There is no  distension.     Palpations: Abdomen is soft.     Tenderness: There is abdominal tenderness (generalized but worse near bruising).     Comments: Several surgical incisions present with bruising near the incisions and along the lower portion of the abdomen.   Musculoskeletal:     Cervical back: Neck supple.  Skin:    General: Skin is warm and dry.     Capillary Refill: Capillary refill takes less than 2 seconds.  Neurological:     Mental Status: She is alert. Mental status is at baseline.  Psychiatric:        Mood and Affect: Mood normal.        Behavior: Behavior normal.           Assessment & Plan:   Problem List Items Addressed This Visit       Cardiovascular and Mediastinum   Essential hypertension - Primary    BP elevated in the hospital likely 2/2 to pain. Controlled today on HCTZ 12.5 and lisinopril 20 mg. Will recheck labs. May need to change HCTZ if hyponatremia persists. Home monitoring and update in 2 weeks  as anticipate as pain improves can reduce back to lisinopril alone.       Relevant Orders   Basic metabolic panel   CBC     Other   Constipation    2/2 to opiate and recent surgery. No sign of bowel obstruction though ER precautions discussed. Miralax TID today, mag citrate tomorrow - update if no BM.       Left inguinal hernia    S/p repair and some post op pain but overall improving and sparing use of tramadol due to side effects. Appreciate surgery support - cont with f/u plan in 2 weeks      Hyponatremia   Relevant Orders   Basic metabolic panel   Anemia    Present in the setting of recent surgery. Recheck today      Relevant Orders   CBC     Return in about 3 weeks (around 03/21/2022) for blood pressure/hernia follow-up.  Lesleigh Noe, MD

## 2022-02-28 NOTE — Discharge Summary (Signed)
[facility] Discharge Summary   Patient ID: Megan Stokes MRN: [id number] DOB/AGE: March 06, 1951 72 y.o.  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  Procedures Dr. Brantley Stage (02/23/22) - Laparoscopic transabdominal repair of left femoral hernia with mesh, left inguinal hernia with mesh, and right inguinal hernia with mesh  Hospital Course:  71 year old female who presented to [facility] ED with groin pain on 9/5.  Workup showed incarcerated left inguinal hernia on CT.  Patient was admitted and underwent procedure listed above.  Tolerated procedure well and was transferred to the floor.  Diet was advanced as tolerated.  On POD1, the patient was voiding well, tolerating diet, ambulating well, pain well controlled, vital signs stable, incisions c/d/i and felt stable for discharge home.  Patient will follow up in our office in 3-4 weeks and knows to call with questions or concerns.    I or a member of my team have reviewed this patient in the Controlled Substance Database.  Allergies as of 02/24/2022       Reactions   Augmentin [amoxicillin-pot Clavulanate] Diarrhea, Nausea And Vomiting   Codeine Nausea And Vomiting        Medication List     TAKE these medications    acetaminophen 500 MG tablet Commonly known as: TYLENOL Take 1,000 mg by mouth daily as needed for mild pain, fever or headache.   alendronate 70 MG tablet Commonly known as: Fosamax Take 1 tablet (70 mg total) by mouth once a week. What changed: when to take this   B-12 PO Take 1 tablet by mouth daily.   D3 PO Take 1 capsule by mouth daily.   docusate sodium 100 MG capsule Commonly known  as: COLACE Take 1 capsule (100 mg total) by mouth 2 (two) times daily as needed for mild constipation.   levothyroxine 75 MCG tablet Commonly known as: SYNTHROID Take 75 mcg by mouth See admin instructions. 75 mcg every day EXCEPT Saturday   lisinopril 20 MG tablet Commonly known as: ZESTRIL Take 1 tablet (20 mg total) by mouth daily. What changed: when to take this   lovastatin 40 MG tablet Commonly known as: MEVACOR TAKE 1 TABLET BY MOUTH ONCE DAILY What changed:  how much to take how to take this when to take this additional instructions   meloxicam 7.5 MG tablet Commonly known as: MOBIC Take 1 tablet (7.5 mg total) by mouth daily. What changed:  when to take this reasons to take this   ondansetron 4 MG disintegrating tablet Commonly known as: ZOFRAN-ODT Take 1 tablet (4 mg total) by mouth every 8 (eight) hours as needed for nausea or vomiting.   polyethylene glycol 17 g packet Commonly known as: MIRALAX / GLYCOLAX Take 17 g by mouth daily as needed for severe constipation.       ASK your doctor about these medications    traMADol 50 MG tablet Commonly known as: ULTRAM Take 1 tablet (50 mg total) by mouth every 6 (six) hours as needed for up to 3 days for moderate pain or severe pain. Ask about: Should I take this medication?          Follow-up Information     [facility], [place] Follow up on 03/16/2022.  Specialty: General Surgery Why: on 03/16/22 at 2:45 pm. Please arrive 30 minutes prior to your appointment for paperwork. Pleaes bring a copy of your photo ID and insurance card. Contact information: [place] [address] [phone]         Lesleigh Noe, MD. Schedule an appointment as soon as possible for a visit.   Specialty: Family Medicine Why: For follow up Contact information: [place] [address] [phone]                 Signed: Caroll Rancher [facility] 02/28/2022, 4:13 PM Please see Amion for pager number during day hours 7:00am-4:30pm

## 2022-02-28 NOTE — Assessment & Plan Note (Signed)
S/p repair and some post op pain but overall improving and sparing use of tramadol due to side effects. Appreciate surgery support - cont with f/u plan in 2 weeks

## 2022-02-28 NOTE — Assessment & Plan Note (Signed)
BP elevated in the hospital likely 2/2 to pain. Controlled today on HCTZ 12.5 and lisinopril 20 mg. Will recheck labs. May need to change HCTZ if hyponatremia persists. Home monitoring and update in 2 weeks as anticipate as pain improves can reduce back to lisinopril alone.

## 2022-02-28 NOTE — Assessment & Plan Note (Signed)
2/2 to opiate and recent surgery. No sign of bowel obstruction though ER precautions discussed. Miralax TID today, mag citrate tomorrow - update if no BM.

## 2022-03-01 ENCOUNTER — Other Ambulatory Visit: Payer: Self-pay | Admitting: Family Medicine

## 2022-03-01 DIAGNOSIS — D649 Anemia, unspecified: Secondary | ICD-10-CM

## 2022-03-01 DIAGNOSIS — E871 Hypo-osmolality and hyponatremia: Secondary | ICD-10-CM

## 2022-03-03 ENCOUNTER — Telehealth: Payer: Self-pay | Admitting: Family Medicine

## 2022-03-03 MED ORDER — LISINOPRIL 40 MG PO TABS: 40.0000 mg | ORAL_TABLET | Freq: Every day | ORAL | 3 refills | Status: DC

## 2022-03-03 NOTE — Telephone Encounter (Signed)
Noted Continue with lisinopril 40 mg once daily, pt states not taking HCTZ Follow-up on Monday as just scheduled with Dr. Einar Pheasant.  Blood pressure looks stable at this point  Medications updated in the chart

## 2022-03-03 NOTE — Telephone Encounter (Signed)
From last note patient blood pressure seems to be much improved since last visit with this new regimen.  I would suggest that she continue current regiment and continue to take blood pressure medication over the weekend and make a follow-up to see Dr. Einar Pheasant next week.  Please confirm that she is taking hydrochlorothiazide 12.5 mg and lisinopril 20 mg once daily, if this is what she is doing continue with current regimen

## 2022-03-03 NOTE — Addendum Note (Signed)
Addended by: Eugenia Pancoast on: 03/03/2022 05:10 PM   Modules accepted: Orders

## 2022-03-03 NOTE — Telephone Encounter (Signed)
Pt notified as instructed and pt voiced understanding. Pt said she is taking lisinopril 40 mg in the AM and stopped HCTZ as instructed from lab result note on 02/28/22. Pt will continue taking Lisinopril 40 mg daily this weekend and appt made with Dr Einar Pheasant for FU on 03/06/22 at 12 noon. UC & ED precautions given and pt voiced understanding. Sending note to Red Christians FNP who is in office and Dr Einar Pheasant who is out of office.

## 2022-03-03 NOTE — Telephone Encounter (Signed)
Patient called in stating she was to call and report her BP readings from Wednesday to Friday. She stated her readings on Wednesday morning was 142/71 and the afternoon was 144/65, Thursday morning was 125/57 and the afternoon was 140/72, and Friday morning was 110/51 and 152/96. She wanting to know if she should continue to double up on the BP pills or not. Please advise. Thank you!

## 2022-03-06 ENCOUNTER — Ambulatory Visit: Payer: Medicare PPO | Admitting: Family Medicine

## 2022-03-06 NOTE — Telephone Encounter (Signed)
Noted, appreciate Tabitha's support

## 2022-03-07 ENCOUNTER — Other Ambulatory Visit (INDEPENDENT_AMBULATORY_CARE_PROVIDER_SITE_OTHER): Payer: Medicare PPO

## 2022-03-07 DIAGNOSIS — D649 Anemia, unspecified: Secondary | ICD-10-CM

## 2022-03-07 DIAGNOSIS — E871 Hypo-osmolality and hyponatremia: Secondary | ICD-10-CM | POA: Diagnosis not present

## 2022-03-07 LAB — BASIC METABOLIC PANEL
BUN: 10 mg/dL (ref 6–23)
CO2: 28 mEq/L (ref 19–32)
Calcium: 8.8 mg/dL (ref 8.4–10.5)
Chloride: 96 mEq/L (ref 96–112)
Creatinine, Ser: 0.58 mg/dL (ref 0.40–1.20)
GFR: 89.91 mL/min (ref >60.00)
Glucose, Bld: 88 mg/dL (ref 70–99)
Potassium: 4.5 mEq/L (ref 3.5–5.1)
Sodium: 132 mEq/L — ABNORMAL LOW (ref 135–145)

## 2022-03-07 LAB — FERRITIN: Ferritin: 126.9 ng/mL (ref 10.0–291.0)

## 2022-03-07 LAB — VITAMIN B12: Vitamin B-12: >1500 pg/mL — ABNORMAL HIGH (ref 211–911)

## 2022-03-09 ENCOUNTER — Telehealth: Payer: Self-pay | Admitting: Family Medicine

## 2022-03-09 DIAGNOSIS — E871 Hypo-osmolality and hyponatremia: Secondary | ICD-10-CM

## 2022-03-09 NOTE — Telephone Encounter (Signed)
Patient called in and would like for someone to call her and go over her lab results with her. Thank you!

## 2022-03-09 NOTE — Telephone Encounter (Signed)
Spoke to pt and went over recent labs. Pt would like to see endocrinology in [place].

## 2022-03-10 NOTE — Telephone Encounter (Signed)
Referral placed.

## 2022-03-10 NOTE — Addendum Note (Signed)
Addended by: Lesleigh Noe on: 03/10/2022 07:42 AM   Modules accepted: Orders

## 2022-03-13 ENCOUNTER — Other Ambulatory Visit: Payer: Self-pay | Admitting: Family Medicine

## 2022-03-17 ENCOUNTER — Ambulatory Visit: Payer: Medicare PPO | Admitting: Obstetrics and Gynecology

## 2022-03-21 ENCOUNTER — Encounter (HOSPITAL_COMMUNITY): Payer: Self-pay

## 2022-03-21 ENCOUNTER — Ambulatory Visit (HOSPITAL_COMMUNITY)
Admission: RE | Admit: 2022-03-21 | Discharge: 2022-03-21 | Disposition: A | Discharge status: Home / Self Care | Payer: Medicare PPO | Source: Ambulatory Visit | Attending: Student | Admitting: Student

## 2022-03-21 ENCOUNTER — Other Ambulatory Visit (HOSPITAL_COMMUNITY): Payer: Self-pay | Admitting: Student

## 2022-03-21 ENCOUNTER — Other Ambulatory Visit: Payer: Self-pay | Admitting: Student

## 2022-03-21 ENCOUNTER — Ambulatory Visit: Payer: Medicare PPO | Admitting: Family

## 2022-03-21 VITALS — BP 150/76 | HR 55 | Temp 98.6°F | Resp 16 | Ht 60.75 in | Wt 109.0 lb

## 2022-03-21 DIAGNOSIS — S301XXA Contusion of abdominal wall, initial encounter: Secondary | ICD-10-CM | POA: Insufficient documentation

## 2022-03-21 DIAGNOSIS — R1032 Left lower quadrant pain: Secondary | ICD-10-CM | POA: Insufficient documentation

## 2022-03-21 DIAGNOSIS — D649 Anemia, unspecified: Secondary | ICD-10-CM | POA: Diagnosis not present

## 2022-03-21 DIAGNOSIS — E871 Hypo-osmolality and hyponatremia: Secondary | ICD-10-CM

## 2022-03-21 DIAGNOSIS — R339 Retention of urine, unspecified: Secondary | ICD-10-CM

## 2022-03-21 DIAGNOSIS — I10 Essential (primary) hypertension: Secondary | ICD-10-CM

## 2022-03-21 LAB — CBC WITH DIFFERENTIAL/PLATELET
Basophils Absolute: 0 10*3/uL (ref 0.0–0.1)
Basophils Relative: 0.5 % (ref 0.0–3.0)
Eosinophils Absolute: 0.2 10*3/uL (ref 0.0–0.7)
Eosinophils Relative: 5.5 % — ABNORMAL HIGH (ref 0.0–5.0)
HCT: 32.3 % — ABNORMAL LOW (ref 36.0–46.0)
Hemoglobin: 11 g/dL — ABNORMAL LOW (ref 12.0–15.0)
Lymphocytes Relative: 25.9 % (ref 12.0–46.0)
Lymphs Abs: 1 10*3/uL (ref 0.7–4.0)
MCHC: 34 g/dL (ref 30.0–36.0)
MCV: 103.5 fl — ABNORMAL HIGH (ref 78.0–100.0)
Monocytes Absolute: 0.5 10*3/uL (ref 0.1–1.0)
Monocytes Relative: 12.4 % — ABNORMAL HIGH (ref 3.0–12.0)
Neutro Abs: 2.1 10*3/uL (ref 1.4–7.7)
Neutrophils Relative %: 55.7 % (ref 43.0–77.0)
Platelets: 301 10*3/uL (ref 150.0–400.0)
RBC: 3.12 Mil/uL — ABNORMAL LOW (ref 3.87–5.11)
RDW: 13.3 % (ref 11.5–15.5)
WBC: 3.8 10*3/uL — ABNORMAL LOW (ref 4.0–10.5)

## 2022-03-21 LAB — IBC + FERRITIN
Ferritin: 96.7 ng/mL (ref 10.0–291.0)
Iron: 103 ug/dL (ref 42–145)
Saturation Ratios: 31.6 % (ref 20.0–50.0)
TIBC: 326.2 ug/dL (ref 250.0–450.0)
Transferrin: 233 mg/dL (ref 212.0–360.0)

## 2022-03-21 LAB — COMPREHENSIVE METABOLIC PANEL
ALT: 7 U/L (ref 0–35)
AST: 14 U/L (ref 0–37)
Albumin: 4.4 g/dL (ref 3.5–5.2)
Alkaline Phosphatase: 67 U/L (ref 39–117)
BUN: 11 mg/dL (ref 6–23)
CO2: 28 mEq/L (ref 19–32)
Calcium: 9.1 mg/dL (ref 8.4–10.5)
Chloride: 94 mEq/L — ABNORMAL LOW (ref 96–112)
Creatinine, Ser: 0.63 mg/dL (ref 0.40–1.20)
GFR: 88.12 mL/min (ref >60.00)
Glucose, Bld: 90 mg/dL (ref 70–99)
Potassium: 4.3 mEq/L (ref 3.5–5.1)
Sodium: 129 mEq/L — ABNORMAL LOW (ref 135–145)
Total Bilirubin: 0.7 mg/dL (ref 0.2–1.2)
Total Protein: 7 g/dL (ref 6.0–8.3)

## 2022-03-21 LAB — POC URINALSYSI DIPSTICK (AUTOMATED)
Bilirubin, UA: NEGATIVE
Blood, UA: NEGATIVE
Glucose, UA: NEGATIVE
Ketones, UA: NEGATIVE
Leukocytes, UA: NEGATIVE
Nitrite, UA: NEGATIVE
Protein, UA: NEGATIVE
Spec Grav, UA: <=1.005 — AB (ref 1.010–1.025)
Urobilinogen, UA: 0.2 E.U./dL
pH, UA: 7 (ref 5.0–8.0)

## 2022-03-21 MED ORDER — SODIUM CHLORIDE (PF) 0.9 % IJ SOLN
INTRAMUSCULAR | Status: AC
  Filled 2022-03-21: qty 50

## 2022-03-21 MED ORDER — IOHEXOL 300 MG/ML  SOLN
100.0000 mL | Freq: Once | INTRAMUSCULAR | Status: AC | PRN
  Administered 2022-03-21: 100 mL via INTRAVENOUS

## 2022-03-21 MED ORDER — LISINOPRIL 20 MG PO TABS: 20.0000 mg | ORAL_TABLET | Freq: Every day | ORAL | 3 refills | Status: DC

## 2022-03-21 MED ORDER — IOHEXOL 9 MG/ML PO SOLN
ORAL | Status: AC
  Filled 2022-03-21: qty 1000

## 2022-03-21 MED ORDER — LISINOPRIL 10 MG PO TABS: 10.0000 mg | ORAL_TABLET | Freq: Every day | ORAL | 3 refills | Status: DC

## 2022-03-21 NOTE — Progress Notes (Signed)
Established Patient Office Visit  Subjective:  Patient ID: Megan Stokes, female    DOB: September 02, 1950  Age: 71 y.o. MRN: [id number]  CC:  Chief Complaint  Patient presents with  . Hypertension    HPI Megan Stokes is here today for follow up.   9/7 left inguinal hernia repair with surgeon Dr. Brantley Stage 9/21 went to surgeons office for f/u suspected hematoma. It has been bothering her, and she plans to call the office today. Has some heat to site, slight redness, but does feel uncomfortable to be touched.   HTN: has been checking her blood pressure at home. Average seems to fluctuate, at times with dizziness. Lowest 121/50 highest 143/72. Taking lisnopril 40 mg once daily.   She does report slight increase in fatigue. Slight shortness of breath, more so in the last few weeks. The past two nights waking up in the middle of the night feels itchy allover.   Does does repot some urinary retention no dysuria. No urinary frequency.   Past Medical History:  Diagnosis Date  . Hypertension   . Hypothyroidism   . PONV (postoperative nausea and vomiting)     Past Surgical History:  Procedure Laterality Date  . BREAST BIOPSY Left   . CESAREAN SECTION     X 2  . HEMORROIDECTOMY    . INGUINAL HERNIA REPAIR Bilateral 02/23/2022   Procedure: LAPAROSCOPIC INGUINAL HERNIA;  Surgeon: Erroll Luna, MD;  Location: [facility];  Service: General;  Laterality: Bilateral;  . SHOULDER SURGERY Left    Pt had limited movement with left shoulder/arm  . THYROID CYST EXCISION      Family History  Problem Relation Age of Onset  . Stroke Mother     Social History   Socioeconomic History  . Marital status: Divorced    Spouse name: Not on file  . Number of children: 3  . Years of education: high school  . Highest education level: Not on file  Occupational History  . Not on file  Tobacco Use  . Smoking status: Former  . Smokeless tobacco: Never  Vaping Use  . Vaping Use: Never used   Substance and Sexual Activity  . Alcohol use: Yes    Alcohol/week: 2.0 - 3.0 standard drinks of alcohol    Types: 2 - 3 Glasses of wine per week    Comment: 2 glasses nightly  . Drug use: No  . Sexual activity: Not Currently  Other Topics Concern  . Not on file  Social History Narrative   09/21/21   From: the area   Living: alone   Work: retired - Engineer, agricultural, Environmental consultant      Family: 3 sons - Theresia Lo, Kevan Ny, Olen Cordial - no grandchildren      Enjoys: work in the yard, cooking, walking      Exercise: walking 5 miles daily   Diet: pretty good      Land belts: Yes    Guns: No   Safe in relationships: Yes       Social Determinants of Radio broadcast assistant Strain: Low Risk  (01/16/2022)   Overall Financial Resource Strain (CARDIA)   . Difficulty of Paying Living Expenses: Not hard at all  Food Insecurity: Not on file  Transportation Needs: No Transportation Needs (01/16/2022)   PRAPARE - Transportation   . Lack of Transportation (Medical): No   . Lack of Transportation (Non-Medical): No  Physical Activity: Sufficiently Active (01/16/2022)   Exercise Vital Sign   .  Days of Exercise per Week: 5 days   . Minutes of Exercise per Session: 40 min  Stress: No Stress Concern Present (01/16/2022)   [place]   . Feeling of Stress : Not at all  Social Connections: Moderately Isolated (01/16/2022)   Social Connection and Isolation Panel [NHANES]   . Frequency of Communication with Friends and Family: More than three times a week   . Frequency of Social Gatherings with Friends and Family: More than three times a week   . Attends Religious Services: More than 4 times per year   . Active Member of Clubs or Organizations: No   . Attends Archivist Meetings: Never   . Marital Status: Divorced  Human resources officer Violence: Not At Risk (01/16/2022)   Humiliation, Afraid, Rape, and Kick questionnaire   . Fear of  Current or Ex-Partner: No   . Emotionally Abused: No   . Physically Abused: No   . Sexually Abused: No    Outpatient Medications Prior to Visit  Medication Sig Dispense Refill  . alendronate (FOSAMAX) 70 MG tablet Take 1 tablet by mouth once a week 12 tablet 0  . Cholecalciferol (D3 PO) Take 1 capsule by mouth daily.    Marland Kitchen docusate sodium (COLACE) 100 MG capsule Take 1 capsule (100 mg total) by mouth 2 (two) times daily as needed for mild constipation. 10 capsule 0  . levothyroxine (SYNTHROID, LEVOTHROID) 75 MCG tablet Take 75 mcg by mouth See admin instructions. 75 mcg every day EXCEPT Saturday    . lisinopril (ZESTRIL) 40 MG tablet Take 1 tablet (40 mg total) by mouth daily. 90 tablet 3  . lovastatin (MEVACOR) 40 MG tablet TAKE 1 TABLET BY MOUTH ONCE DAILY (Patient taking differently: Take 40 mg by mouth every evening.) 90 tablet 1  . ondansetron (ZOFRAN-ODT) 4 MG disintegrating tablet Take 1 tablet (4 mg total) by mouth every 8 (eight) hours as needed for nausea or vomiting. 21 tablet 0  . polyethylene glycol (MIRALAX / GLYCOLAX) 17 g packet Take 17 g by mouth daily as needed for severe constipation. 14 each 0  . acetaminophen (TYLENOL) 500 MG tablet Take 1,000 mg by mouth daily as needed for mild pain, fever or headache. (Patient not taking: Reported on 03/21/2022)    . Cyanocobalamin (B-12 PO) Take 1 tablet by mouth daily. (Patient not taking: Reported on 03/21/2022)    . meloxicam (MOBIC) 7.5 MG tablet Take 1 tablet (7.5 mg total) by mouth daily. (Patient not taking: Reported on 03/21/2022) 30 tablet 0   No facility-administered medications prior to visit.    Allergies  Allergen Reactions  . Augmentin [Amoxicillin-Pot Clavulanate] Diarrhea and Nausea And Vomiting  . Codeine Nausea And Vomiting         Objective:    Physical Exam Constitutional:      General: She is not in acute distress.    Appearance: Normal appearance. She is normal weight. She is not ill-appearing,  toxic-appearing or diaphoretic.  Cardiovascular:     Rate and Rhythm: Normal rate and regular rhythm.  Pulmonary:     Effort: Pulmonary effort is normal.     Breath sounds: Normal breath sounds.  Abdominal:     General: Abdomen is flat.     Palpations: There is mass (tender mass left lower quadrant abdomen with slight tenderness lower left suprapubic aspect as well).     Tenderness: There is abdominal tenderness.     Hernia: No hernia  is present.    Neurological:     Mental Status: She is alert.      BP (!) 150/76   Pulse (!) 55   Temp 98.6 F (37 C)   Resp 16   Ht 5' 0.75" (1.543 m)   Wt 109 lb (49.4 kg)   SpO2 94%   BMI 20.77 kg/m  Wt Readings from Last 3 Encounters:  03/21/22 109 lb (49.4 kg)  02/28/22 106 lb 8 oz (48.3 kg)  02/23/22 107 lb (48.5 kg)     Health Maintenance Due  Topic Date Due  . Hepatitis C Screening  Never done  . COVID-19 Vaccine (5 - Pfizer series) 03/09/2021  . INFLUENZA VACCINE  01/17/2022    There are no preventive care reminders to display for this patient.  Lab Results  Component Value Date   TSH 0.90 02/14/2022   Lab Results  Component Value Date   WBC 4.2 02/28/2022   HGB 10.3 (L) 02/28/2022   HCT 29.1 (L) 02/28/2022   MCV 100.8 (H) 02/28/2022   PLT 349.0 02/28/2022   Lab Results  Component Value Date   NA 132 (L) 03/07/2022   K 4.5 03/07/2022   CO2 28 03/07/2022   GLUCOSE 88 03/07/2022   BUN 10 03/07/2022   CREATININE 0.58 03/07/2022   BILITOT 0.4 02/21/2022   ALKPHOS 56 02/21/2022   AST 19 02/21/2022   ALT 14 02/21/2022   PROT 7.3 02/21/2022   ALBUMIN 4.2 02/21/2022   CALCIUM 8.8 03/07/2022   ANIONGAP 9 02/24/2022   GFR 89.91 03/07/2022   Lab Results  Component Value Date   CHOL 171 02/14/2022   Lab Results  Component Value Date   HDL 82.20 02/14/2022   Lab Results  Component Value Date   LDLCALC 80 02/14/2022   Lab Results  Component Value Date   TRIG 46.0 02/14/2022   Lab Results  Component  Value Date   CHOLHDL 2 02/14/2022   No results found for: "HGBA1C"    Assessment & Plan:   Problem List Items Addressed This Visit   None   No orders of the defined types were placed in this encounter.   Follow-up: No follow-ups on file.    Eugenia Pancoast, FNP

## 2022-03-21 NOTE — Patient Instructions (Addendum)
  Call this number for endocrinology referral  [phone]  They should ave your referral and be able to schedule you for ongoing low sodium.   Will try to get this covered, sending in 20 mg and 10 mg lisinopril to take together for total of 30 mg once daily. Please let me know if not covered.  Start monitoring your blood pressure daily, around the same time of day, for the next 2-3 weeks.  Ensure that you have rested for 30 minutes prior to checking your blood pressure. Record your readings and bring them to your next visit.   Regards,   Eugenia Pancoast FNP-C

## 2022-03-22 ENCOUNTER — Other Ambulatory Visit: Payer: Self-pay | Admitting: Family

## 2022-03-22 ENCOUNTER — Telehealth: Payer: Self-pay

## 2022-03-22 DIAGNOSIS — R339 Retention of urine, unspecified: Secondary | ICD-10-CM | POA: Insufficient documentation

## 2022-03-22 DIAGNOSIS — E871 Hypo-osmolality and hyponatremia: Secondary | ICD-10-CM

## 2022-03-22 NOTE — Telephone Encounter (Signed)
See lab result notes

## 2022-03-22 NOTE — Assessment & Plan Note (Signed)
Decrease lisinopril to 30 mg once daily  Pt advised of the following:  Continue medication as prescribed. Monitor blood pressure periodically and/or when you feel symptomatic. Goal is <130/90 on average. Ensure that you have rested for 30 minutes prior to checking your blood pressure. Record your readings and bring them to your next visit if necessary.work on a low sodium diet.

## 2022-03-22 NOTE — Addendum Note (Signed)
Addended by: Ellamae Sia on: 03/22/2022 11:39 AM   Modules accepted: Orders

## 2022-03-22 NOTE — Assessment & Plan Note (Signed)
Ordering cmp pending results ?

## 2022-03-22 NOTE — Assessment & Plan Note (Signed)
D/w pt surgeon, as increasing ongoing pain they are going to order CT abd and have pt f/u with them as post op

## 2022-03-22 NOTE — Assessment & Plan Note (Signed)
Order cbc and ibc ferritin pending results

## 2022-03-22 NOTE — Assessment & Plan Note (Signed)
Acute finding poct urine dip in office Urine culture ordered pending results

## 2022-03-22 NOTE — Progress Notes (Signed)
Anemia remained stable Eosinophils are slightly elevated is patient experience any allergy-like symptoms or rash? Her sodium has dropped pretty significantly this could be increasing fatigue. If she experience any nausea extreme fatigue? I suggest that she increases her salt intake and would be good if she eats about 3 hours daily and have her schedule a 1 week follow-up lab only appointment to repeat the sodium level  Iron is in good range

## 2022-03-23 LAB — URINE CULTURE
MICRO NUMBER:: 14000826
Result:: NO GROWTH
SPECIMEN QUALITY:: ADEQUATE

## 2022-03-29 ENCOUNTER — Other Ambulatory Visit (INDEPENDENT_AMBULATORY_CARE_PROVIDER_SITE_OTHER): Payer: Medicare PPO

## 2022-03-29 DIAGNOSIS — E871 Hypo-osmolality and hyponatremia: Secondary | ICD-10-CM

## 2022-03-29 LAB — BASIC METABOLIC PANEL
BUN: 14 mg/dL (ref 6–23)
CO2: 28 mEq/L (ref 19–32)
Calcium: 9.4 mg/dL (ref 8.4–10.5)
Chloride: 98 mEq/L (ref 96–112)
Creatinine, Ser: 0.64 mg/dL (ref 0.40–1.20)
GFR: 87.77 mL/min (ref >60.00)
Glucose, Bld: 80 mg/dL (ref 70–99)
Potassium: 4.1 mEq/L (ref 3.5–5.1)
Sodium: 133 mEq/L — ABNORMAL LOW (ref 135–145)

## 2022-04-05 ENCOUNTER — Other Ambulatory Visit: Payer: Self-pay

## 2022-04-06 MED ORDER — LOVASTATIN 40 MG PO TABS: ORAL_TABLET | ORAL | 1 refills | Status: DC

## 2022-04-10 ENCOUNTER — Encounter: Payer: Self-pay | Admitting: *Deleted

## 2022-04-27 ENCOUNTER — Encounter: Payer: Medicare PPO | Admitting: Family Medicine

## 2022-05-08 ENCOUNTER — Ambulatory Visit
Admission: EM | Admit: 2022-05-08 | Discharge: 2022-05-08 | Disposition: A | Discharge status: Home / Self Care | Payer: Medicare PPO | Attending: Physician Assistant | Admitting: Physician Assistant

## 2022-05-08 DIAGNOSIS — J069 Acute upper respiratory infection, unspecified: Secondary | ICD-10-CM | POA: Insufficient documentation

## 2022-05-08 DIAGNOSIS — Z1152 Encounter for screening for COVID-19: Secondary | ICD-10-CM | POA: Diagnosis present

## 2022-05-08 LAB — RESP PANEL BY RT-PCR (FLU A&B, COVID) ARPGX2
Influenza A by PCR: POSITIVE — AB
Influenza B by PCR: NEGATIVE
SARS Coronavirus 2 by RT PCR: NEGATIVE

## 2022-05-08 NOTE — ED Triage Notes (Signed)
Pt presents with nasal congestion, cough, headache, and generalized body aches X 2 days.

## 2022-05-08 NOTE — ED Provider Notes (Signed)
[name]    CSN: [id number] Arrival date & time: 05/08/22  1351      History   Chief Complaint Chief Complaint  Patient presents with   URI   Headache   Generalized Body Aches    HPI Megan Stokes is a 71 y.o. female.   Patient here today for evaluation of nasal congestion, cough, headache and generalized body aches she had for 2 days.  She is unsure if fever as her thermometer broke.  She has tried over-the-counter medication without resolution of symptoms.  She denies any vomiting or diarrhea.  The history is provided by the patient.  URI Presenting symptoms: congestion, cough and sore throat   Presenting symptoms: no ear pain and no fever   Associated symptoms: headaches and myalgias   Associated symptoms: no wheezing   Headache Associated symptoms: congestion, cough, myalgias, sore throat and URI   Associated symptoms: no abdominal pain, no diarrhea, no ear pain, no fever, no nausea and no vomiting     Past Medical History:  Diagnosis Date   Hypertension    Hypothyroidism    PONV (postoperative nausea and vomiting)     Patient Active Problem List   Diagnosis Date Noted   Urinary retention 03/22/2022   Abdominal wall hematoma 03/21/2022   Hyponatremia 02/28/2022   Anemia 02/28/2022   Left inguinal hernia 02/21/2022   Cystocele with prolapse 10/25/2021   Abdominal pain 10/25/2021   Essential hypertension 09/21/2021   Change in bowel habit 08/12/2020   Constipation 08/12/2020   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   Personal history of colonic polyps 08/12/2020   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   Fibroma 09/04/2019   Osteoarthritis of right knee 06/19/2019   Osteoporosis 06/19/2019   Hypothyroidism 06/08/2017   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      Home Medications    Prior to Admission medications   Medication Sig Start Date End Date Taking? Authorizing Provider  alendronate (FOSAMAX) 70 MG tablet Take 1 tablet by mouth once a week 03/16/22   Waunita Schooner, MD  Cholecalciferol (D3 PO) Take 1 capsule by mouth daily.    [provider]  docusate sodium (COLACE) 100 MG capsule Take 1 capsule (100 mg total) by mouth 2 (two) times daily as needed for mild constipation. 02/24/22   Winferd Humphrey, PA-C  levothyroxine (SYNTHROID, LEVOTHROID) 75 MCG tablet Take 75 mcg by mouth See admin instructions. 75 mcg every day EXCEPT Saturday    [provider]  lisinopril (ZESTRIL) 10 MG tablet Take 1 tablet (10 mg total) by mouth daily. 03/21/22   Eugenia Pancoast, FNP  lisinopril (ZESTRIL) 20 MG tablet Take 1 tablet (20 mg total) by mouth daily. 03/21/22   Eugenia Pancoast, FNP  lovastatin (MEVACOR) 40 MG tablet TAKE 1 TABLET BY MOUTH ONCE DAILY 04/06/22   Eugenia Pancoast, FNP  ondansetron (ZOFRAN-ODT) 4 MG disintegrating tablet Take 1 tablet (4 mg total) by mouth  every 8 (eight) hours as needed for nausea or vomiting. 12/20/21   Hazel Sams, PA-C  polyethylene glycol (MIRALAX / GLYCOLAX) 17 g packet Take 17 g by mouth daily as needed for severe constipation. 02/24/22   Winferd Humphrey, PA-C  lisinopril (ZESTRIL) 20 MG tablet Take 1 tablet (20 mg total) by mouth daily. Patient taking differently: Take 40 mg by mouth at bedtime. 11/02/21   Waunita Schooner, MD    Family History Family History  Problem Relation Age of Onset   Stroke Mother     Social History Social History   Tobacco Use   Smoking status: Former   Smokeless tobacco: Never   Scientific laboratory technician Use: Never used  Substance Use Topics   Alcohol use: Yes    Alcohol/week: 2.0 - 3.0 standard drinks of alcohol    Types: 2 - 3 Glasses of wine per week    Comment: 2 glasses nightly   Drug use: No     Allergies   Augmentin [amoxicillin-pot clavulanate] and Codeine   Review of Systems Review of Systems  Constitutional:  Negative for chills and fever.  HENT:  Positive for congestion and sore throat. Negative for ear pain.   Eyes:  Negative for discharge and redness.  Respiratory:  Positive for cough. Negative for shortness of breath and wheezing.   Gastrointestinal:  Negative for abdominal pain, diarrhea, nausea and vomiting.  Musculoskeletal:  Positive for myalgias.  Neurological:  Positive for headaches.     Physical Exam Triage Vital Signs ED Triage Vitals  Enc Vitals Group     BP 05/08/22 1542 (!) 167/79     Pulse Rate 05/08/22 1542 65     Resp 05/08/22 1542 17     Temp 05/08/22 1542 98.8 F (37.1 C)     Temp Source 05/08/22 1542 Oral     SpO2 05/08/22 1542 98 %     Weight --      Height --      Head Circumference --      Peak Flow --      Pain Score 05/08/22 1541 5     Pain Loc --      Pain Edu? --      Excl. in [place]? --    No data found.  Updated Vital Signs BP (!) 167/79 (BP Location: Left Arm)   Pulse 65   Temp 98.8 F (37.1 C) (Oral)   Resp 17   SpO2 98%      Physical Exam Vitals and nursing note reviewed.  Constitutional:      General: She is not in acute distress.    Appearance: Normal appearance. She is not ill-appearing.  HENT:     Head: Normocephalic and atraumatic.     Nose: Congestion present.     Mouth/Throat:     Mouth: Mucous membranes are moist.     Pharynx: No oropharyngeal exudate or posterior oropharyngeal erythema.  Eyes:     Conjunctiva/sclera: Conjunctivae normal.  Cardiovascular:     Rate and Rhythm: Normal rate and regular rhythm.     Heart sounds: Normal heart sounds. No murmur heard. Pulmonary:      Effort: Pulmonary effort is normal. No respiratory distress.     Breath sounds: Normal breath sounds. No wheezing, rhonchi or rales.  Skin:    General: Skin is warm and dry.  Neurological:     Mental Status: She is alert.  Psychiatric:        Mood  and Affect: Mood normal.        Thought Content: Thought content normal.      UC Treatments / Results  Labs (all labs ordered are listed, but only abnormal results are displayed) Labs Reviewed  RESP PANEL BY RT-PCR (FLU A&B, COVID) [id number]    EKG   Radiology No results found.  Procedures Procedures (including critical care time)  Medications Ordered in UC Medications - No data to display  Initial Impression / Assessment and Plan / UC Course  I have reviewed the triage vital signs and the nursing notes.  Pertinent labs & imaging results that were available during my care of the patient were reviewed by me and considered in my medical decision making (see chart for details).    Suspect viral etiology of symptoms.  Will screen for COVID and flu.  Recommended symptomatic treatment, increase fluids and rest with further evaluation with any further concerns or persistent symptoms.  Final Clinical Impressions(s) / UC Diagnoses   Final diagnoses:  Acute upper respiratory infection  Encounter for screening for COVID-19   Discharge Instructions   None    ED Prescriptions   None    PDMP not reviewed this encounter.   Francene Finders, PA-C 05/08/22 Bosie Helper

## 2022-05-09 ENCOUNTER — Telehealth: Payer: Self-pay | Admitting: Family

## 2022-05-09 ENCOUNTER — Other Ambulatory Visit: Payer: Self-pay | Admitting: Family

## 2022-05-09 NOTE — Telephone Encounter (Signed)
Pt wants to know if she need to get blood work done . Please advise Encourage patient to contact the pharmacy for refills or they can request refills through [facility]:  Please schedule appointment if longer than 1 year  NEXT APPOINTMENT DATE:  MEDICATION:lisinopril (ZESTRIL) 10 MG tablet   Is the patient out of medication?   PHARMACY: [facility] ([facility]), [place] [address]    Let patient know to contact pharmacy at the end of the day to make sure medication is ready.  Please notify patient to allow 48-72 hours to process  CLINICAL FILLS OUT ALL BELOW:   LAST REFILL:  QTY:  REFILL DATE:    OTHER COMMENTS:    Okay for refill?  Please advise

## 2022-05-09 NOTE — Telephone Encounter (Signed)
I sent in one year supply lisinopril 10/3 to [facility] should have refills available.   As far as labs as long as pt feeling ok we can wait for f/u to repeat labs

## 2022-05-10 ENCOUNTER — Telehealth: Payer: Self-pay

## 2022-05-10 DIAGNOSIS — I10 Essential (primary) hypertension: Secondary | ICD-10-CM

## 2022-05-10 MED ORDER — LISINOPRIL 30 MG PO TABS: 30.0000 mg | ORAL_TABLET | Freq: Every day | ORAL | 0 refills | Status: DC

## 2022-05-10 NOTE — Telephone Encounter (Signed)
Note sent to Allie Bossier. Called the pharmacy and they stated they insurance would not cover the '10mg'$  since she has already picked up the 20 mg. Pharmacy said they make a 30 mg, Rx request sent to kate.

## 2022-05-10 NOTE — Telephone Encounter (Signed)
Rx changed to Lisinopril 30 mg.

## 2022-05-14 NOTE — Telephone Encounter (Signed)
I see Allie Bossier Sent in 30 mg. Thank you so much Anda Kraft for sending.

## 2022-05-25 DIAGNOSIS — N816 Rectocele: Secondary | ICD-10-CM | POA: Insufficient documentation

## 2022-06-08 ENCOUNTER — Telehealth: Payer: Self-pay

## 2022-06-08 MED ORDER — ALENDRONATE SODIUM 70 MG PO TABS: 70.0000 mg | ORAL_TABLET | ORAL | 0 refills | Status: DC

## 2022-06-08 NOTE — Addendum Note (Signed)
Addended by: Pleas Koch on: 06/08/2022 05:58 PM   Modules accepted: Orders

## 2022-07-11 LAB — HM DEXA SCAN

## 2022-07-11 LAB — HM MAMMOGRAPHY

## 2022-07-17 ENCOUNTER — Ambulatory Visit: Payer: Medicare PPO | Admitting: Family

## 2022-07-17 ENCOUNTER — Encounter: Payer: Self-pay | Admitting: Family

## 2022-07-17 VITALS — BP 128/84 | HR 60 | Temp 98.4°F | Ht 60.08 in | Wt 110.6 lb

## 2022-07-17 DIAGNOSIS — M5442 Lumbago with sciatica, left side: Secondary | ICD-10-CM

## 2022-07-17 DIAGNOSIS — R102 Pelvic and perineal pain: Secondary | ICD-10-CM | POA: Diagnosis not present

## 2022-07-17 DIAGNOSIS — N3941 Urge incontinence: Secondary | ICD-10-CM

## 2022-07-17 DIAGNOSIS — I10 Essential (primary) hypertension: Secondary | ICD-10-CM | POA: Diagnosis not present

## 2022-07-17 DIAGNOSIS — E039 Hypothyroidism, unspecified: Secondary | ICD-10-CM | POA: Diagnosis not present

## 2022-07-17 DIAGNOSIS — G8929 Other chronic pain: Secondary | ICD-10-CM

## 2022-07-17 DIAGNOSIS — K5900 Constipation, unspecified: Secondary | ICD-10-CM

## 2022-07-17 DIAGNOSIS — N816 Rectocele: Secondary | ICD-10-CM

## 2022-07-17 DIAGNOSIS — M5441 Lumbago with sciatica, right side: Secondary | ICD-10-CM

## 2022-07-17 LAB — MICROALBUMIN / CREATININE URINE RATIO
Creatinine,U: 13.1 mg/dL
Microalb Creat Ratio: 5.3 mg/g (ref 0.0–30.0)
Microalb, Ur: <0.7 mg/dL (ref 0.0–1.9)

## 2022-07-17 LAB — URINALYSIS, ROUTINE W REFLEX MICROSCOPIC
Bilirubin Urine: NEGATIVE
Hgb urine dipstick: NEGATIVE
Ketones, ur: NEGATIVE
Leukocytes,Ua: NEGATIVE
Nitrite: NEGATIVE
Specific Gravity, Urine: 1.01 (ref 1.000–1.030)
Total Protein, Urine: NEGATIVE
Urine Glucose: NEGATIVE
Urobilinogen, UA: 0.2 (ref 0.0–1.0)
pH: 7.5 (ref 5.0–8.0)

## 2022-07-17 MED ORDER — LEVOTHYROXINE SODIUM 75 MCG PO TABS: 75.0000 ug | ORAL_TABLET | ORAL | 3 refills | Status: DC

## 2022-07-17 MED ORDER — DOCUSATE SODIUM 100 MG PO CAPS: 100.0000 mg | ORAL_CAPSULE | Freq: Three times a day (TID) | ORAL | Status: DC | PRN

## 2022-07-17 NOTE — Assessment & Plan Note (Signed)
Urine sample and urinalysis today pending results.

## 2022-07-17 NOTE — Patient Instructions (Signed)
  Reach out to surgeon to see if they can consider moving up the surgery Start daily laxative as well as stool softener to see if this helps with regulation.    Regards,   Eugenia Pancoast FNP-C

## 2022-07-17 NOTE — Assessment & Plan Note (Signed)
Maintain appt with surgeon. Continue with stool softeners.

## 2022-07-17 NOTE — Progress Notes (Signed)
Established Patient Office Visit  Subjective:      CC:  Chief Complaint  Patient presents with   Medical Management of Chronic Issues    HPI: Megan Stokes is a 72 y.o. female presenting on 07/17/2022 for Medical Management of Chronic Issues .   New complaints:  Concerns with bowel movements. Having to take stool softener nightly , worse in the last few weeks for relief of a bowel movement. Does state has a rectocele, has appt for surgery march 2023. Is not currently using miralax. Has had some slight lower pelvic pain, had inguinal hernia repair in September 2023. Has had pain since then.   Has also noticed some midline to lower back pain dull and achy. Not sharp or stabbing. Has noticed this for a while now. Has had back surgery in the past. She does state at times will radiate down bil lower legs. Worse with standing.   Ongoing pelvic pain, recent u/s transvaginal shows that unable to truly evaluate endometrium so they are also going to do endometrial biopsy at the time of per op visit, which is early feb. This is with urologist Dr. Nechama Guard.    Social history:  Relevant past medical, surgical, family and social history reviewed and updated as indicated. Interim medical history since our last visit reviewed.  Allergies and medications reviewed and updated.  DATA REVIEWED: CHART IN EPIC     ROS: Negative unless specifically indicated above in HPI.    Current Outpatient Medications:    alendronate (FOSAMAX) 70 MG tablet, Take 1 tablet (70 mg total) by mouth once a week., Disp: 12 tablet, Rfl: 0   Cholecalciferol (D3 PO), Take 1 capsule by mouth daily., Disp: , Rfl:    levothyroxine (SYNTHROID) 75 MCG tablet, Take 1 tablet (75 mcg total) by mouth See admin instructions. 75 mcg every day EXCEPT Saturday, Disp: 90 tablet, Rfl: 3   lisinopril (ZESTRIL) 30 MG tablet, Take 1 tablet (30 mg total) by mouth daily. for blood pressure., Disp: 90 tablet, Rfl: 0   lovastatin  (MEVACOR) 40 MG tablet, TAKE 1 TABLET BY MOUTH ONCE DAILY, Disp: 90 tablet, Rfl: 1   ondansetron (ZOFRAN-ODT) 4 MG disintegrating tablet, Take 1 tablet (4 mg total) by mouth every 8 (eight) hours as needed for nausea or vomiting., Disp: 21 tablet, Rfl: 0   valACYclovir (VALTREX) 1000 MG tablet, TAKE 2 TABLETS BY MOUTH EVERY 12 HOURS FOR 1 DAY, Disp: , Rfl:    docusate sodium (COLACE) 100 MG capsule, Take 1 capsule (100 mg total) by mouth 3 (three) times daily as needed for mild constipation., Disp: , Rfl:       Objective:    BP 128/84   Pulse 60   Temp 98.4 F (36.9 C) (Oral)   Ht 5' 0.08" (1.526 m)   Wt 110 lb 9.6 oz (50.2 kg)   SpO2 98%   BMI 21.55 kg/m   Wt Readings from Last 3 Encounters:  07/17/22 110 lb 9.6 oz (50.2 kg)  03/21/22 109 lb (49.4 kg)  02/28/22 106 lb 8 oz (48.3 kg)    Physical Exam Constitutional:      General: She is not in acute distress.    Appearance: Normal appearance. She is normal weight. She is not ill-appearing, toxic-appearing or diaphoretic.  HENT:     Head: Normocephalic.  Cardiovascular:     Rate and Rhythm: Normal rate.  Pulmonary:     Effort: Pulmonary effort is normal.  Abdominal:  Tenderness: There is abdominal tenderness (left lower abdominal tenderness and moderate right lower pelvic pain).  Musculoskeletal:        General: Normal range of motion.  Neurological:     General: No focal deficit present.     Mental Status: She is alert and oriented to person, place, and time. Mental status is at baseline.  Psychiatric:        Mood and Affect: Mood normal.        Behavior: Behavior normal.        Thought Content: Thought content normal.        Judgment: Judgment normal.          Assessment & Plan:  Essential hypertension -     Microalbumin / creatinine urine ratio  Hypothyroidism, unspecified type -     Levothyroxine Sodium; Take 1 tablet (75 mcg total) by mouth See admin instructions. 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 Continue with stool softeners.    Constipation, unspecified constipation type Assessment & Plan: Worsening likely due to rectocele, advised pt to start daily stool softener and daily laxative to help with movement.    Urge incontinence of urine Assessment & Plan: Urine sample and urinalysis today pending results.   Other orders -     Docusate Sodium; Take 1 capsule (100 mg total) by mouth 3 (three) times daily as needed for mild constipation.     No follow-ups on file.  Eugenia Pancoast, MSN, APRN, FNP-C [place]

## 2022-07-17 NOTE — Assessment & Plan Note (Signed)
Worsening likely due to rectocele, advised pt to start daily stool softener and daily laxative to help with movement.

## 2022-07-17 NOTE — Assessment & Plan Note (Signed)
Pending endometrial biopsy scheduled in the next few weeks

## 2022-07-17 NOTE — Assessment & Plan Note (Signed)
Declines lumbar xray today  Advised pt to work on exercises, tylenol prn and lidocaine patches to sit prn

## 2022-07-18 LAB — URINE CULTURE
MICRO NUMBER:: 14486442
Result:: NO GROWTH
SPECIMEN QUALITY:: ADEQUATE

## 2022-08-04 ENCOUNTER — Other Ambulatory Visit: Payer: Self-pay | Admitting: Primary Care

## 2022-08-04 DIAGNOSIS — I10 Essential (primary) hypertension: Secondary | ICD-10-CM

## 2022-08-07 ENCOUNTER — Encounter: Payer: Self-pay | Admitting: Internal Medicine

## 2022-08-07 ENCOUNTER — Telehealth: Payer: Self-pay

## 2022-08-07 ENCOUNTER — Telehealth (INDEPENDENT_AMBULATORY_CARE_PROVIDER_SITE_OTHER): Payer: Medicare PPO | Admitting: Internal Medicine

## 2022-08-07 VITALS — Ht 60.0 in | Wt 110.0 lb

## 2022-08-07 DIAGNOSIS — U071 COVID-19: Secondary | ICD-10-CM | POA: Diagnosis not present

## 2022-08-07 MED ORDER — NIRMATRELVIR/RITONAVIR (PAXLOVID)TABLET: 3.0000 | ORAL_TABLET | Freq: Two times a day (BID) | ORAL | 0 refills | Status: AC

## 2022-08-07 NOTE — Progress Notes (Signed)
Subjective:    Patient ID: Megan Stokes, female    DOB: 12-26-1950, 72 y.o.   MRN: [id number]  HPI Video virtual visit due to COVID infection Identification done Reviewed limitations and billing and she gave consent Participants--patient in her home and I am in my office  Just felt bad upon arising yesterday Some congestion the day before--2 days ago Has headache, lots of aching, slight nausea and diarrhea Unclear if she has fever--but some chills Lots of dry cough No SOB  Used some saline for nose--not much help Tried afrin No analgesics  Current Outpatient Medications on File Prior to Visit  Medication Sig Dispense Refill   alendronate (FOSAMAX) 70 MG tablet Take 1 tablet (70 mg total) by mouth once a week. 12 tablet 0   Cholecalciferol (D3 PO) Take 1 capsule by mouth daily.     docusate sodium (COLACE) 100 MG capsule Take 1 capsule (100 mg total) by mouth 3 (three) times daily as needed for mild constipation.     levothyroxine (SYNTHROID) 75 MCG tablet Take 1 tablet (75 mcg total) by mouth See admin instructions. 75 mcg every day EXCEPT Saturday 90 tablet 3   lisinopril (ZESTRIL) 30 MG tablet Take 1 tablet by mouth once daily for blood pressure 90 tablet 3   lovastatin (MEVACOR) 40 MG tablet TAKE 1 TABLET BY MOUTH ONCE DAILY 90 tablet 1   ondansetron (ZOFRAN-ODT) 4 MG disintegrating tablet Take 1 tablet (4 mg total) by mouth every 8 (eight) hours as needed for nausea or vomiting. 21 tablet 0   valACYclovir (VALTREX) 1000 MG tablet TAKE 2 TABLETS BY MOUTH EVERY 12 HOURS FOR 1 DAY (Patient not taking: Reported on 08/07/2022)     No current facility-administered medications on file prior to visit.    Allergies  Allergen Reactions   Augmentin [Amoxicillin-Pot Clavulanate] Diarrhea and Nausea And Vomiting   Codeine Nausea And Vomiting    Past Medical History:  Diagnosis Date   Hypertension    Hypothyroidism    PONV (postoperative nausea and vomiting)     Past  Surgical History:  Procedure Laterality Date   BREAST BIOPSY Left    CESAREAN SECTION     X 2   HEMORROIDECTOMY     INGUINAL HERNIA REPAIR Bilateral 02/23/2022   Procedure: LAPAROSCOPIC INGUINAL HERNIA;  Surgeon: Erroll Luna, MD;  Location: [facility];  Service: General;  Laterality: Bilateral;   SHOULDER SURGERY Left    Pt had limited movement with left shoulder/arm   THYROID CYST EXCISION      Family History  Problem Relation Age of Onset   Stroke Mother    Dementia Father     Social History   Socioeconomic History   Marital status: Divorced    Spouse name: Not on file   Number of children: 3   Years of education: high school   Highest education level: Not on file  Occupational History   Not on file  Tobacco Use   Smoking status: Former   Smokeless tobacco: Never  Vaping Use   Vaping Use: Never used  Substance and Sexual Activity   Alcohol use: Yes    Alcohol/week: 2.0 - 3.0 standard drinks of alcohol    Types: 2 - 3 Glasses of wine per week    Comment: 2 glasses nightly   Drug use: No   Sexual activity: Not Currently  Other Topics Concern   Not on file  Social History Narrative   09/21/21   From: the area  Living: alone   Work: retired Banker, Environmental consultant      Family: 3 sons - Megan Stokes, Megan Stokes, Megan Stokes - no grandchildren      Enjoys: work in the yard, cooking, walking      Exercise: walking 5 miles daily   Diet: pretty good      Land belts: Yes    Guns: No   Safe in relationships: Yes       Social Determinants of Radio broadcast assistant Strain: Low Risk  (01/16/2022)   Overall Financial Resource Strain (CARDIA)    Difficulty of Paying Living Expenses: Not hard at all  Food Insecurity: Not on file  Transportation Needs: No Transportation Needs (01/16/2022)   PRAPARE - Hydrologist (Medical): No    Lack of Transportation (Non-Medical): No  Physical Activity: Sufficiently Active (01/16/2022)   Exercise Vital Sign     Days of Exercise per Week: 5 days    Minutes of Exercise per Session: 40 min  Stress: No Stress Concern Present (01/16/2022)   [place]    Feeling of Stress : Not at all  Social Connections: Moderately Isolated (01/16/2022)   Social Connection and Isolation Panel [NHANES]    Frequency of Communication with Friends and Family: More than three times a week    Frequency of Social Gatherings with Friends and Family: More than three times a week    Attends Religious Services: More than 4 times per year    Active Member of [employer] or Organizations: No    Attends Archivist Meetings: Never    Marital Status: Divorced  Human resources officer Violence: Not At Risk (01/16/2022)   Humiliation, Afraid, Rape, and Kick questionnaire    Fear of Current or Ex-Partner: No    Emotionally Abused: No    Physically Abused: No    Sexually Abused: No   Review of Systems No loss of smell or taste Appetite is off--did have some soup    Objective:   Physical Exam Constitutional:      Appearance: Normal appearance.  Pulmonary:     Effort: Pulmonary effort is normal. No respiratory distress.  Neurological:     Mental Status: She is alert.            Assessment & Plan:

## 2022-08-07 NOTE — Telephone Encounter (Signed)
Per appt notes pt already has VV scheduled with Dr Silvio Pate for 08/07/22 at 11:15. Sending note to Dr Silvio Pate and Silvio Pate pool.

## 2022-08-07 NOTE — Assessment & Plan Note (Addendum)
Symptoms are not severe--but she does have some systemic symptoms Recommended regular tylenol Discussed quarantine and then masking Discussed antivirals--and she would like to try Will Rx the paxlovid---should not take the lovastatin while on the paxlovid. It can affect the levothyroxine--but I doubt that would be clinically significant. Asked her to stop it if she has any significant GI side effects Call EMS if sig SOB

## 2022-08-07 NOTE — Telephone Encounter (Signed)
See OV note.  

## 2022-08-07 NOTE — Telephone Encounter (Signed)
[place] Night - Client TELEPHONE ADVICE RECORD [facility] Patient Name: Megan Stokes Gender: Female DOB: [birth year] Age: 72 Y 70 M 12 D Return Phone Number: [phone] (Primary) Address: City/ State/ Zip: [place]  [address] Client [place] Night - Client Client Site [place] Provider AA - PHYSICIAN, NOT LISTED- MD Contact Type Call Who Is Calling Patient / Member / Family / Caregiver Call Type Triage / Clinical Relationship To Patient Self Return Phone Number [phone] (Primary) Chief Complaint Headache Reason for Call Symptomatic / Request for [name] states she tested pos twice for covid and she has a HA, congestion, has a fever, and has body aches. She wants a Rx for herself. Translation No Nurse Assessment Nurse: Jearld Pies, RN, Lovena Le Date/Time Eilene Ghazi Time): 08/06/2022 9:20:27 AM Confirm and document reason for call. If symptomatic, describe symptoms. ---Caller states she tested pos twice for covid this morning. She has a HA, congestion, fever, and has body aches that started yesterday. No thermometer. No fever reducer has been taken. Drinking fluids and urinating normally. Last urination this morning. Does the patient have any new or worsening symptoms? ---Yes Will a triage be completed? ---Yes Related visit to physician within the last 2 weeks? ---No Does the PT have any chronic conditions? (i.e. diabetes, asthma, this includes High risk factors for pregnancy, etc.) ---Yes List chronic conditions. ---HTN Is this a behavioral health or substance abuse call? ---No Guidelines Guideline Title Affirmed Question Affirmed Notes Nurse Date/Time ([place] Time) COVID-19 - Diagnosed or Suspected MILD difficulty breathing (e.g., minimal/no SOB at rest, SOB with walking, pulse <100) Jearld Pies, Corrie Dandy 08/06/2022 9:22:33 AM PLEASE  NOTE: All timestamps contained within this report are represented as [place] Standard Time. CONFIDENTIALTY NOTICE: This fax transmission is intended only for the addressee. It contains information that is legally privileged, confidential or otherwise protected from use or disclosure. If you are not the intended recipient, you are strictly prohibited from reviewing, disclosing, copying using or disseminating any of this information or taking any action in reliance on or regarding this information. If you have received this fax in error, please notify us immediately by telephone so that we can arrange for its return to [place]. Phone: [phone], Toll-Free: [phone], Fax: [phone] Page: 2 of 2 Call Id: [id number] [place]. Time Eilene Ghazi Time) Disposition Final User 08/06/2022 9:26:34 AM See HCP within 4 Hours (or PCP triage) Yes Jearld Pies, RN, Lovena Le Final Disposition 08/06/2022 9:26:34 AM See HCP within 4 Hours (or PCP triage) Yes Jearld Pies, RN, Apolonio Schneiders Disagree/Comply Comply Caller Understands Yes PreDisposition Did not know what to do Care Advice Given Per Guideline SEE HCP (OR PCP TRIAGE) WITHIN 4 HOURS: * IF OFFICE WILL BE CLOSED AND NO PCP (PRIMARY CARE PROVIDER) SECOND-LEVEL TRIAGE: You need to be seen within the next 3 or 4 hours. A nearby Urgent Care Center [facility]) is often a good source of care. Another choice is to go to the ED. Go sooner if you become worse. CALL BACK IF: Referrals [facility]

## 2022-08-28 ENCOUNTER — Telehealth: Payer: Self-pay

## 2022-08-28 NOTE — Transitions of Care (Post Inpatient/ED Visit) (Signed)
   08/28/2022  Name: Megan Stokes MRN: [id number] DOB: 11-03-1950  Today's TOC FU Call Status: Today's TOC FU Call Status:: Successful TOC FU Call Competed TOC FU Call Complete Date: 08/28/22  Transition Care Management Follow-up Telephone Call Date of Discharge: 08/25/22 Discharge Facility: Other ([place]) Name of Other (Non-[facility]) Discharge Facility: [facility] Type of Discharge: Inpatient Admission Primary Inpatient Discharge Diagnosis:: Nausea UTI How have you been since you were released from the hospital?: Better Any questions or concerns?: No  Items Reviewed: Did you receive and understand the discharge instructions provided?: Yes Medications obtained and verified?: Yes (Medications Reviewed) Any new allergies since your discharge?: No Dietary orders reviewed?: Yes Do you have support at home?: No  Home Care and Equipment/Supplies: [place] Ordered?: No Any new equipment or medical supplies ordered?: No  Functional Questionnaire: Do you need assistance with bathing/showering or dressing?: No Do you need assistance with meal preparation?: No Do you need assistance with eating?: No Do you have difficulty maintaining continence: No Do you need assistance with getting out of bed/getting out of a chair/moving?: No Do you have difficulty managing or taking your medications?: No  Folllow up appointments reviewed: PCP Follow-up appointment confirmed?: Yes Date of PCP follow-up appointment?: 09/07/22 Follow-up Provider: Eugenia Pancoast [facility] Follow-up appointment confirmed?: Yes Date of Specialist follow-up appointment?: 10/02/22 Follow-Up Specialty Provider:: Dr Zigmund Daniel Do you need transportation to your follow-up appointment?: No Do you understand care options if your condition(s) worsen?: Yes-patient verbalized understanding    [place] LPN [place] Direct Dial [phone]

## 2022-09-01 ENCOUNTER — Other Ambulatory Visit: Payer: Self-pay | Admitting: Primary Care

## 2022-09-07 ENCOUNTER — Encounter: Payer: Self-pay | Admitting: Family

## 2022-09-07 ENCOUNTER — Ambulatory Visit: Payer: Medicare PPO | Admitting: Family

## 2022-09-07 VITALS — BP 124/72 | HR 80 | Temp 97.8°F | Ht 60.0 in | Wt 110.2 lb

## 2022-09-07 DIAGNOSIS — R935 Abnormal findings on diagnostic imaging of other abdominal regions, including retroperitoneum: Secondary | ICD-10-CM

## 2022-09-07 DIAGNOSIS — D649 Anemia, unspecified: Secondary | ICD-10-CM | POA: Diagnosis not present

## 2022-09-07 DIAGNOSIS — E871 Hypo-osmolality and hyponatremia: Secondary | ICD-10-CM | POA: Diagnosis not present

## 2022-09-07 DIAGNOSIS — R011 Cardiac murmur, unspecified: Secondary | ICD-10-CM | POA: Diagnosis not present

## 2022-09-07 LAB — BASIC METABOLIC PANEL
BUN: 10 mg/dL (ref 6–23)
CO2: 29 mEq/L (ref 19–32)
Calcium: 9.2 mg/dL (ref 8.4–10.5)
Chloride: 95 mEq/L — ABNORMAL LOW (ref 96–112)
Creatinine, Ser: 0.58 mg/dL (ref 0.40–1.20)
GFR: 89.6 mL/min (ref >60.00)
Glucose, Bld: 79 mg/dL (ref 70–99)
Potassium: 4.5 mEq/L (ref 3.5–5.1)
Sodium: 130 mEq/L — ABNORMAL LOW (ref 135–145)

## 2022-09-07 LAB — CBC WITH DIFFERENTIAL/PLATELET
Basophils Absolute: 0 10*3/uL (ref 0.0–0.1)
Basophils Relative: 0.7 % (ref 0.0–3.0)
Eosinophils Absolute: 0.1 10*3/uL (ref 0.0–0.7)
Eosinophils Relative: 2.1 % (ref 0.0–5.0)
HCT: 28.7 % — ABNORMAL LOW (ref 36.0–46.0)
Hemoglobin: 9.7 g/dL — ABNORMAL LOW (ref 12.0–15.0)
Lymphocytes Relative: 27 % (ref 12.0–46.0)
Lymphs Abs: 1.2 10*3/uL (ref 0.7–4.0)
MCHC: 33.7 g/dL (ref 30.0–36.0)
MCV: 105.5 fl — ABNORMAL HIGH (ref 78.0–100.0)
Monocytes Absolute: 0.6 10*3/uL (ref 0.1–1.0)
Monocytes Relative: 14.5 % — ABNORMAL HIGH (ref 3.0–12.0)
Neutro Abs: 2.5 10*3/uL (ref 1.4–7.7)
Neutrophils Relative %: 55.7 % (ref 43.0–77.0)
Platelets: 410 10*3/uL — ABNORMAL HIGH (ref 150.0–400.0)
RBC: 2.72 Mil/uL — ABNORMAL LOW (ref 3.87–5.11)
RDW: 13 % (ref 11.5–15.5)
WBC: 4.5 10*3/uL (ref 4.0–10.5)

## 2022-09-07 NOTE — Patient Instructions (Signed)
  I ordered an echocardiogram today, if you do not hear directly from the center to schedule please let me know. Should in the next 2 weeks or so.   Stop by the lab prior to leaving today. I will notify you of your results once received. .  If you experience any change in bowel movements, mucous in the stool and or increased rectal and bowel movements pain let me know.    Regards,   Eugenia Pancoast FNP-C

## 2022-09-07 NOTE — Progress Notes (Signed)
Established Patient Hospital follow up Visit Subjective:   Patient ID: Megan Stokes, female    DOB: Sep 21, 1950  Age: 72 y.o. MRN: [id number]  CC:  Chief Complaint  Patient presents with   Hospitalization Follow-up        HPI  Megan Stokes is a 72 y.o. female presenting on 09/07/2022 for  hospital follow up.    Went to [facility] ER 3/5 for UTI post op, had underwent sacrospinous fixation and enterocele repair, and has noticed blood in urine and dysuria since the procedure.  Foley was removed. Urinalysis indicated a UTI, was treated with ceftriaxone, then was given omnicef upon discharge.   CT abd pelvis, 08/25/22: Presacral hematomas, suspected to be postsurgical.  Complex cystic lesion in pelvis /favored to reflect inflamed small bowel loops Thickening of the rectum, suspected postsurgical vs proctitis  Large colonic stool burden Findings were discussed with urogyn, and they are to f/u with pt and repeat cbc and further evaluate fluid collection/cystic lesion.   Cbc showed anemia,  Lab Results  Component Value Date   WBC 4.5 09/07/2022   HGB 9.7 (L) 09/07/2022   HCT 28.7 (L) 09/07/2022   MCV 105.5 (H) 09/07/2022   PLT 410.0 (H) 09/07/2022    Saw urologist Dr. Algis Greenhouse yesterday, reported pt was feeling a lot better. Hyponatremia had resolved with replacement in the hospital, pt also increasing her salt intake and drinking less water throughout the day. Hypertension improved, was hypotensive in the hospital. Incomplete bladder emptying was like r/o weak detrusor per urologist. Megan Stokes was Dr. Zigmund Daniel.  Has f/u appt April 15th.  Urinalysis yesterday in urology office with only small amount of leukocytes.   Pt reports no longer with rectal pressure, has been improving greatly since surgery.  Bowel movements back to normal and on a daily regular schedule.   Allergies  Allergen Reactions   Augmentin [Amoxicillin-Pot Clavulanate] Diarrhea and  Nausea And Vomiting   Codeine Nausea And Vomiting   ----------------   Social history:  Relevant past medical, surgical, family and social history reviewed and updated as indicated. Interim medical history since our last visit reviewed.  Allergies and medications reviewed and updated.  DATA REVIEWED: CHART IN EPIC    ROS: Negative unless specifically indicated above in HPI.    Current Outpatient Medications:    alendronate (FOSAMAX) 70 MG tablet, TAKE 1 TABLET BY MOUTH ONCE A WEEK NEED APPOINTMENT FOR BONE DENSITY, Disp: 12 tablet, Rfl: 0   Cholecalciferol (D3 PO), Take 1 capsule by mouth daily., Disp: , Rfl:    docusate sodium (COLACE) 100 MG capsule, Take 1 capsule (100 mg total) by mouth 3 (three) times daily as needed for mild constipation., Disp: , Rfl:    levothyroxine (SYNTHROID) 75 MCG tablet, Take 1 tablet (75 mcg total) by mouth See admin instructions. 75 mcg every day EXCEPT Saturday, Disp: 90 tablet, Rfl: 3   lisinopril (ZESTRIL) 30 MG tablet, Take 1 tablet by mouth once daily for blood pressure, Disp: 90 tablet, Rfl: 3   lovastatin (MEVACOR) 40 MG tablet, TAKE 1 TABLET BY MOUTH ONCE DAILY, Disp: 90 tablet, Rfl: 1   ondansetron (ZOFRAN-ODT) 4 MG disintegrating tablet, Take 1 tablet (4 mg total) by mouth every 8 (eight) hours as needed for nausea or vomiting., Disp: 21 tablet, Rfl: 0   valACYclovir (VALTREX) 1000 MG tablet, , Disp: , Rfl:       Objective:    BP 124/72   Pulse 80  Temp 97.8 F (36.6 C) (Temporal)   Ht 5' (1.524 m)   Wt 110 lb 3.2 oz (50 kg)   SpO2 98%   BMI 21.52 kg/m   Wt Readings from Last 3 Encounters:  09/07/22 110 lb 3.2 oz (50 kg)  08/07/22 110 lb (49.9 kg)  07/17/22 110 lb 9.6 oz (50.2 kg)    Physical Exam Constitutional:      General: She is not in acute distress.    Appearance: Normal appearance. She is normal weight. She is not ill-appearing, toxic-appearing or diaphoretic.  HENT:     Head: Normocephalic.  Cardiovascular:      Rate and Rhythm: Normal rate and regular rhythm.     Heart sounds: Murmur heard.  Pulmonary:     Effort: Pulmonary effort is normal.  Musculoskeletal:        General: Normal range of motion.  Neurological:     General: No focal deficit present.     Mental Status: She is alert and oriented to person, place, and time. Mental status is at baseline.  Psychiatric:        Mood and Affect: Mood normal.        Behavior: Behavior normal.        Thought Content: Thought content normal.        Judgment: Judgment normal.         Assessment & Plan:  Hyponatremia Assessment & Plan: Repeat sodium level pending results  Orders: -     Basic metabolic panel  Anemia, unspecified type Assessment & Plan: Repeat cbc however may be decreased due to recent surgery   Orders: -     CBC with Differential/Platelet  Heart murmur Assessment & Plan: On exam today and in ER Ordering echocardiogram, pending results  Orders: -     ECHOCARDIOGRAM COMPLETE; Future  Abnormal CT of the abdomen Assessment & Plan: Asymptomatic today  Thickening of rectum, postsurgical vs proctitis As pt without symptoms will monitor for now, if symptoms such as change in bowel movements, blood in stool, will refer pt to GI however suspect this is related to recent surgery      Return in about 3 months (around 12/08/2022) for f/u anemia.  Eugenia Pancoast, FNP

## 2022-09-11 ENCOUNTER — Other Ambulatory Visit: Payer: Self-pay | Admitting: Family

## 2022-09-11 DIAGNOSIS — R935 Abnormal findings on diagnostic imaging of other abdominal regions, including retroperitoneum: Secondary | ICD-10-CM | POA: Insufficient documentation

## 2022-09-11 DIAGNOSIS — D649 Anemia, unspecified: Secondary | ICD-10-CM

## 2022-09-11 NOTE — Progress Notes (Signed)
Any chance we can add ibc ferritin?

## 2022-09-11 NOTE — Progress Notes (Signed)
Sodium dipping just slightly, work on increasing salt intake. I usually recommend a few pickles or olives a day they are packed with sodium.   There is some new anemia, worse from five months ago, likely from surgery this would be expected. Are you taking iron? If no, I suggest starting some iron. Any blood in the stool?  Let's repeat lab only in the office in three weeks to make sure levels improving.

## 2022-09-11 NOTE — Assessment & Plan Note (Signed)
Repeat cbc however may be decreased due to recent surgery

## 2022-09-11 NOTE — Assessment & Plan Note (Signed)
Repeat sodium level pending results

## 2022-09-11 NOTE — Assessment & Plan Note (Signed)
Asymptomatic today  Thickening of rectum, postsurgical vs proctitis As pt without symptoms will monitor for now, if symptoms such as change in bowel movements, blood in stool, will refer pt to GI however suspect this is related to recent surgery

## 2022-09-11 NOTE — Assessment & Plan Note (Signed)
On exam today and in ER Ordering echocardiogram, pending results

## 2022-10-03 ENCOUNTER — Ambulatory Visit (HOSPITAL_COMMUNITY)
Admission: RE | Admit: 2022-10-03 | Discharge: 2022-10-03 | Disposition: A | Discharge status: Home / Self Care | Payer: Medicare PPO | Source: Ambulatory Visit | Attending: Family | Admitting: Family

## 2022-10-03 DIAGNOSIS — E785 Hyperlipidemia, unspecified: Secondary | ICD-10-CM | POA: Insufficient documentation

## 2022-10-03 DIAGNOSIS — I351 Nonrheumatic aortic (valve) insufficiency: Secondary | ICD-10-CM | POA: Diagnosis not present

## 2022-10-03 DIAGNOSIS — I10 Essential (primary) hypertension: Secondary | ICD-10-CM | POA: Insufficient documentation

## 2022-10-03 DIAGNOSIS — R011 Cardiac murmur, unspecified: Secondary | ICD-10-CM | POA: Insufficient documentation

## 2022-10-03 LAB — ECHOCARDIOGRAM COMPLETE
AR max vel: 2.76 cm2
AV Area VTI: 2.61 cm2
AV Area mean vel: 2.62 cm2
AV Mean grad: 7.5 mmHg
AV Peak grad: 13.8 mmHg
Ao pk vel: 1.86 m/s
Area-P 1/2: 3.21 cm2
P 1/2 time: 964 msec
S' Lateral: 3 cm

## 2022-10-03 NOTE — Progress Notes (Signed)
  Echocardiogram 2D Echocardiogram has been performed.  Megan Stokes 10/03/2022, 10:57 AM

## 2022-10-04 ENCOUNTER — Other Ambulatory Visit: Payer: Self-pay | Admitting: Family

## 2022-10-04 ENCOUNTER — Other Ambulatory Visit (INDEPENDENT_AMBULATORY_CARE_PROVIDER_SITE_OTHER): Payer: Medicare PPO

## 2022-10-04 DIAGNOSIS — D649 Anemia, unspecified: Secondary | ICD-10-CM | POA: Diagnosis not present

## 2022-10-04 DIAGNOSIS — E7800 Pure hypercholesterolemia, unspecified: Secondary | ICD-10-CM | POA: Diagnosis not present

## 2022-10-04 LAB — LIPID PANEL
Cholesterol: 174 mg/dL (ref 0–200)
HDL: 79.2 mg/dL (ref >39.00)
LDL Cholesterol: 84 mg/dL (ref 0–99)
NonHDL: 94.69
Total CHOL/HDL Ratio: 2
Triglycerides: 53 mg/dL (ref 0.0–149.0)
VLDL: 10.6 mg/dL (ref 0.0–40.0)

## 2022-10-04 LAB — IBC + FERRITIN
Ferritin: 69.5 ng/mL (ref 10.0–291.0)
Iron: 87 ug/dL (ref 42–145)
Saturation Ratios: 26.8 % (ref 20.0–50.0)
TIBC: 324.8 ug/dL (ref 250.0–450.0)
Transferrin: 232 mg/dL (ref 212.0–360.0)

## 2022-10-04 LAB — CBC WITH DIFFERENTIAL/PLATELET
Basophils Absolute: 0 10*3/uL (ref 0.0–0.1)
Basophils Relative: 1.1 % (ref 0.0–3.0)
Eosinophils Absolute: 0.1 10*3/uL (ref 0.0–0.7)
Eosinophils Relative: 3 % (ref 0.0–5.0)
HCT: 32.5 % — ABNORMAL LOW (ref 36.0–46.0)
Hemoglobin: 11 g/dL — ABNORMAL LOW (ref 12.0–15.0)
Lymphocytes Relative: 32.9 % (ref 12.0–46.0)
Lymphs Abs: 1 10*3/uL (ref 0.7–4.0)
MCHC: 34 g/dL (ref 30.0–36.0)
MCV: 104.6 fl — ABNORMAL HIGH (ref 78.0–100.0)
Monocytes Absolute: 0.4 10*3/uL (ref 0.1–1.0)
Monocytes Relative: 12.3 % — ABNORMAL HIGH (ref 3.0–12.0)
Neutro Abs: 1.5 10*3/uL (ref 1.4–7.7)
Neutrophils Relative %: 50.7 % (ref 43.0–77.0)
Platelets: 300 10*3/uL (ref 150.0–400.0)
RBC: 3.1 Mil/uL — ABNORMAL LOW (ref 3.87–5.11)
RDW: 12.5 % (ref 11.5–15.5)
WBC: 2.9 10*3/uL — ABNORMAL LOW (ref 4.0–10.5)

## 2022-10-04 NOTE — Progress Notes (Signed)
Pending consult with cardiology to see if she would require consult.  Otherwise we will work on modifiable risk factors with strict control of cholesterol with lovastatin and maybe additional agent. Ordering lipid panel for pt to have as last drawn was 9/23

## 2022-10-05 ENCOUNTER — Other Ambulatory Visit: Payer: Self-pay | Admitting: Family

## 2022-10-05 DIAGNOSIS — D72819 Decreased white blood cell count, unspecified: Secondary | ICD-10-CM

## 2022-10-13 ENCOUNTER — Other Ambulatory Visit: Payer: Self-pay | Admitting: Family

## 2022-10-13 ENCOUNTER — Other Ambulatory Visit: Payer: Medicare PPO

## 2022-10-16 ENCOUNTER — Encounter: Payer: Self-pay | Admitting: Family

## 2022-10-16 ENCOUNTER — Other Ambulatory Visit: Payer: Medicare PPO

## 2022-10-16 ENCOUNTER — Ambulatory Visit: Payer: Medicare PPO | Admitting: Family

## 2022-10-16 VITALS — BP 132/82 | HR 64 | Temp 97.6°F | Ht 60.0 in | Wt 109.0 lb

## 2022-10-16 DIAGNOSIS — R10819 Abdominal tenderness, unspecified site: Secondary | ICD-10-CM | POA: Diagnosis not present

## 2022-10-16 DIAGNOSIS — D72819 Decreased white blood cell count, unspecified: Secondary | ICD-10-CM | POA: Diagnosis not present

## 2022-10-16 DIAGNOSIS — R011 Cardiac murmur, unspecified: Secondary | ICD-10-CM

## 2022-10-16 LAB — CBC WITH DIFFERENTIAL/PLATELET
Basophils Absolute: 0 10*3/uL (ref 0.0–0.1)
Basophils Relative: 0.7 % (ref 0.0–3.0)
Eosinophils Absolute: 0.1 10*3/uL (ref 0.0–0.7)
Eosinophils Relative: 2.2 % (ref 0.0–5.0)
HCT: 34.4 % — ABNORMAL LOW (ref 36.0–46.0)
Hemoglobin: 11.7 g/dL — ABNORMAL LOW (ref 12.0–15.0)
Lymphocytes Relative: 25.7 % (ref 12.0–46.0)
Lymphs Abs: 1.1 10*3/uL (ref 0.7–4.0)
MCHC: 34.2 g/dL (ref 30.0–36.0)
MCV: 104.3 fl — ABNORMAL HIGH (ref 78.0–100.0)
Monocytes Absolute: 0.5 10*3/uL (ref 0.1–1.0)
Monocytes Relative: 11.9 % (ref 3.0–12.0)
Neutro Abs: 2.6 10*3/uL (ref 1.4–7.7)
Neutrophils Relative %: 59.5 % (ref 43.0–77.0)
Platelets: 297 10*3/uL (ref 150.0–400.0)
RBC: 3.29 Mil/uL — ABNORMAL LOW (ref 3.87–5.11)
RDW: 12.1 % (ref 11.5–15.5)
WBC: 4.3 10*3/uL (ref 4.0–10.5)

## 2022-10-16 NOTE — Progress Notes (Signed)
Established Patient Office Visit  Subjective:      CC:  Chief Complaint  Patient presents with   Medical Management of Chronic Issues    HPI: Megan Stokes is a 72 y.o. female presenting on 10/16/2022 for Medical Management of Chronic Issues . Recent echocardiogram: did show significant stenosis, cholesterol and blood pressure well controlled.  Lab Results  Component Value Date   CHOL 174 10/04/2022   HDL 79.20 10/04/2022   LDLCALC 84 10/04/2022   TRIG 53.0 10/04/2022   CHOLHDL 2 10/04/2022   Lab Results  Component Value Date   WBC 2.9 (L) 10/04/2022   HGB 11.0 (L) 10/04/2022   HCT 32.5 (L) 10/04/2022   MCV 104.6 (H) 10/04/2022   PLT 300.0 10/04/2022   Hld: doing well ldl 84 which is near goal   Cbc, last visit with some leukopenia we will recheck today and see if resolution.   Has appt with endocrinologist pending for July 2024, for chronic hyponatremia.       Social history:  Relevant past medical, surgical, family and social history reviewed and updated as indicated. Interim medical history since our last visit reviewed.  Allergies and medications reviewed and updated.  DATA REVIEWED: CHART IN EPIC     ROS: Negative unless specifically indicated above in HPI.    Current Outpatient Medications:    acetaminophen (TYLENOL) 500 MG tablet, Take by mouth., Disp: , Rfl:    alendronate (FOSAMAX) 70 MG tablet, TAKE 1 TABLET BY MOUTH ONCE A WEEK NEED APPOINTMENT FOR BONE DENSITY, Disp: 12 tablet, Rfl: 0   Cholecalciferol (D3 PO), Take 1 capsule by mouth daily., Disp: , Rfl:    cyanocobalamin (VITAMIN B12) 1000 MCG tablet, Take by mouth., Disp: , Rfl:    docusate sodium (COLACE) 100 MG capsule, Take 1 capsule (100 mg total) by mouth 3 (three) times daily as needed for mild constipation., Disp: , Rfl:    levothyroxine (SYNTHROID) 75 MCG tablet, Take 1 tablet (75 mcg total) by mouth See admin instructions. 75 mcg every day EXCEPT Saturday, Disp: 90 tablet,  Rfl: 3   lisinopril (ZESTRIL) 30 MG tablet, Take 1 tablet by mouth once daily for blood pressure, Disp: 90 tablet, Rfl: 3   lovastatin (MEVACOR) 40 MG tablet, Take 1 tablet by mouth once daily, Disp: 90 tablet, Rfl: 0   ondansetron (ZOFRAN-ODT) 4 MG disintegrating tablet, Take 1 tablet (4 mg total) by mouth every 8 (eight) hours as needed for nausea or vomiting., Disp: 21 tablet, Rfl: 0   valACYclovir (VALTREX) 1000 MG tablet, , Disp: , Rfl:       Objective:    BP 132/82 (BP Location: Right Arm)   Pulse 64   Temp 97.6 F (36.4 C) (Temporal)   Ht 5' (1.524 m)   Wt 109 lb (49.4 kg)   SpO2 99%   BMI 21.29 kg/m   Wt Readings from Last 3 Encounters:  10/16/22 109 lb (49.4 kg)  09/07/22 110 lb 3.2 oz (50 kg)  08/07/22 110 lb (49.9 kg)    Physical Exam Constitutional:      General: She is not in acute distress.    Appearance: Normal appearance. She is normal weight. She is not ill-appearing, toxic-appearing or diaphoretic.  HENT:     Head: Normocephalic.  Cardiovascular:     Rate and Rhythm: Normal rate and regular rhythm.  Pulmonary:     Effort: Pulmonary effort is normal.  Abdominal:     General: There is distension (mild).  Tenderness: There is abdominal tenderness in the suprapubic area.     Comments: Mild bil suprapubic tenderness on exam  Bil inguinal tenderness  Musculoskeletal:        General: Normal range of motion.  Neurological:     General: No focal deficit present.     Mental Status: She is alert and oriented to person, place, and time. Mental status is at baseline.  Psychiatric:        Mood and Affect: Mood normal.        Behavior: Behavior normal.        Thought Content: Thought content normal.        Judgment: Judgment normal.             Assessment & Plan:  Suprapubic tenderness Assessment & Plan: Suspected to be in relation to post op  Advised pt to continue to monitor, if no improvement advised pt to let me know and we may consider further  imaging due to recent imaging of the abdomen proctitis vs post surgical  Recent urine with urology was unremarkable   Leukopenia, unspecified type Assessment & Plan: Repeat cbc today pending results  Orders: -     CBC with Differential/Platelet  Heart murmur Assessment & Plan: Echocardiogram reassuring  Did d/w her the goal of hld and htn management which are so far within goal       Return in about 6 months (around 04/17/2023) for f/u CPE, f/u cholesterol.  Mort Sawyers, MSN, APRN, FNP-C [facility]

## 2022-10-16 NOTE — Assessment & Plan Note (Signed)
Echocardiogram reassuring  Did d/w her the goal of hld and htn management which are so far within goal

## 2022-10-16 NOTE — Assessment & Plan Note (Signed)
Suspected to be in relation to post op  Advised pt to continue to monitor, if no improvement advised pt to let me know and we may consider further imaging due to recent imaging of the abdomen proctitis vs post surgical  Recent urine with urology was unremarkable

## 2022-10-16 NOTE — Assessment & Plan Note (Signed)
Repeat cbc today pending results.  

## 2022-11-25 ENCOUNTER — Other Ambulatory Visit: Payer: Self-pay | Admitting: Family

## 2022-12-08 ENCOUNTER — Encounter: Payer: Self-pay | Admitting: Family

## 2022-12-08 ENCOUNTER — Ambulatory Visit: Payer: Medicare PPO | Admitting: Family

## 2022-12-08 VITALS — BP 130/78 | HR 60 | Temp 97.6°F | Ht 60.0 in | Wt 108.0 lb

## 2022-12-08 DIAGNOSIS — K5904 Chronic idiopathic constipation: Secondary | ICD-10-CM

## 2022-12-08 DIAGNOSIS — E871 Hypo-osmolality and hyponatremia: Secondary | ICD-10-CM

## 2022-12-08 DIAGNOSIS — N3941 Urge incontinence: Secondary | ICD-10-CM | POA: Diagnosis not present

## 2022-12-08 DIAGNOSIS — D649 Anemia, unspecified: Secondary | ICD-10-CM

## 2022-12-08 MED ORDER — LINACLOTIDE 72 MCG PO CAPS
72.0000 ug | ORAL_CAPSULE | Freq: Every day | ORAL | 0 refills | Status: DC
Start: 2022-12-08 — End: 2022-12-26

## 2022-12-08 NOTE — Progress Notes (Signed)
Established Patient Office Visit  Subjective:      CC:  Chief Complaint  Patient presents with   Anemia    HPI: Megan Stokes is a 71 y.o. female presenting on 12/08/2022 for Anemia . Chronic hyponatremia, pending appt with endocrinology.   New complaints: Having trouble sleeping , sleeps on average 5-6 hours including nap in the day. She does wake up often throughout the night. She has not tried anything over the counter for sleep. She does get up with frequent urination. She does think this is sometimes the reason for this. She is still getting up maybe 2-3 times a night, she does see urology Dr. Delbert Phenix.   Thyroid: is taking levothyroxine 75 mcg once daily with other medications.   Chronic constipation: taking 3-4 stool softeners a night.    Social history:  Relevant past medical, surgical, family and social history reviewed and updated as indicated. Interim medical history since our last visit reviewed.  Allergies and medications reviewed and updated.  DATA REVIEWED: CHART IN EPIC     ROS: Negative unless specifically indicated above in HPI.    Current Outpatient Medications:    acetaminophen (TYLENOL) 500 MG tablet, Take by mouth., Disp: , Rfl:    alendronate (FOSAMAX) 70 MG tablet, TAKE 1 TABLET BY MOUTH ONCE A WEEK . APPOINTMENT REQUIRED FOR FUTURE REFILLS, Disp: 12 tablet, Rfl: 0   Cholecalciferol (D3 PO), Take 1 capsule by mouth daily., Disp: , Rfl:    cyanocobalamin (VITAMIN B12) 1000 MCG tablet, Take by mouth., Disp: , Rfl:    docusate sodium (COLACE) 100 MG capsule, Take 1 capsule (100 mg total) by mouth 3 (three) times daily as needed for mild constipation., Disp: , Rfl:    levothyroxine (SYNTHROID) 75 MCG tablet, Take 1 tablet (75 mcg total) by mouth See admin instructions. 75 mcg every day EXCEPT Saturday, Disp: 90 tablet, Rfl: 3   linaclotide (LINZESS) 72 MCG capsule, Take 1 capsule (72 mcg total) by mouth daily before breakfast., Disp: 30 capsule,  Rfl: 0   lisinopril (ZESTRIL) 30 MG tablet, Take 1 tablet by mouth once daily for blood pressure, Disp: 90 tablet, Rfl: 3   lovastatin (MEVACOR) 40 MG tablet, Take 1 tablet by mouth once daily, Disp: 90 tablet, Rfl: 0   ondansetron (ZOFRAN-ODT) 4 MG disintegrating tablet, Take 1 tablet (4 mg total) by mouth every 8 (eight) hours as needed for nausea or vomiting., Disp: 21 tablet, Rfl: 0   valACYclovir (VALTREX) 1000 MG tablet, , Disp: , Rfl:       Objective:    BP 130/78   Pulse 60   Temp 97.6 F (36.4 C) (Temporal)   Ht 5' (1.524 m)   Wt 108 lb (49 kg)   SpO2 97%   BMI 21.09 kg/m   Wt Readings from Last 3 Encounters:  12/08/22 108 lb (49 kg)  10/16/22 109 lb (49.4 kg)  09/07/22 110 lb 3.2 oz (50 kg)    Physical Exam Constitutional:      General: She is not in acute distress.    Appearance: Normal appearance. She is normal weight. She is not ill-appearing, toxic-appearing or diaphoretic.  HENT:     Head: Normocephalic.  Cardiovascular:     Rate and Rhythm: Normal rate.  Pulmonary:     Effort: Pulmonary effort is normal.  Musculoskeletal:        General: Normal range of motion.  Neurological:     General: No focal deficit present.  Mental Status: She is alert and oriented to person, place, and time. Mental status is at baseline.  Psychiatric:        Mood and Affect: Mood normal.        Behavior: Behavior normal.        Thought Content: Thought content normal.        Judgment: Judgment normal.           Assessment & Plan:  Chronic idiopathic constipation Assessment & Plan: Trial start linzess start at 72 mcg once daily  Did advise may cause diarrhea 1-2 weeks then should resolve.  If no improvement may increase to 145 mcg once daily   Orders: -     linaCLOtide; Take 1 capsule (72 mcg total) by mouth daily before breakfast.  Dispense: 30 capsule; Refill: 0  Hyponatremia Assessment & Plan: Keep appt with endo as scheduled.   Urge incontinence of  urine Assessment & Plan: With nocturia.  Advised pt to see urology for consult    Anemia, unspecified type Assessment & Plan: improving      Return in about 6 months (around 06/09/2023) for f/u CPE.  Mort Sawyers, MSN, APRN, FNP-C [facility]

## 2022-12-08 NOTE — Assessment & Plan Note (Signed)
Keep appt with endo as scheduled.

## 2022-12-08 NOTE — Assessment & Plan Note (Signed)
improving

## 2022-12-08 NOTE — Assessment & Plan Note (Signed)
Trial start linzess start at 72 mcg once daily  Did advise may cause diarrhea 1-2 weeks then should resolve.  If no improvement may increase to 145 mcg once daily

## 2022-12-08 NOTE — Assessment & Plan Note (Signed)
With nocturia.  Advised pt to see urology for consult

## 2022-12-08 NOTE — Patient Instructions (Signed)
  Call urology to check on urinary incontinence which may be affecting sleep.    Regards,   Mort Sawyers FNP-C

## 2022-12-26 ENCOUNTER — Encounter: Payer: Self-pay | Admitting: Internal Medicine

## 2022-12-26 ENCOUNTER — Ambulatory Visit: Payer: Medicare PPO | Admitting: Internal Medicine

## 2022-12-26 VITALS — BP 134/76 | HR 65 | Ht 60.0 in | Wt 110.0 lb

## 2022-12-26 DIAGNOSIS — E871 Hypo-osmolality and hyponatremia: Secondary | ICD-10-CM | POA: Diagnosis not present

## 2022-12-26 NOTE — Progress Notes (Unsigned)
Name: Megan Stokes  MRN/ DOB: [id number], December 29, 1950    Age/ Sex: 72 y.o., female    PCP: Mort Sawyers, FNP   Reason for Endocrinology Evaluation: Hyponatremia     Date of Initial Endocrinology Evaluation: 12/26/2022     HPI: Megan Stokes is a 72 y.o. female with a past medical history of hypothyroid, HTN, dyslipidemia. The patient presented for initial endocrinology clinic visit on 12/26/2022 for consultative assistance with her hyponatremia.   Patient has been noted with hyponatremia since 2015, with a nadir of 129 mEq/L 03/2022  She is on levothyroxine for years   Has been having recent headaches, frontal in nature  Has noted visual changes, past due for an eye exam  No diuretic use  No antihistamine or PPI  She drinks 1 glass of wine with supper  She drinks decaf coffee  (x2) and water and diet coke( ~ 16 oz) Denies polydipsia Has polyuria  Nocturia 2-3x  Has occasional constipation and diarrhea   Denies LE edema  No CHF or cihorris     HISTORY:  Past Medical History:  Past Medical History:  Diagnosis Date   Hypertension    Hypothyroidism    PONV (postoperative nausea and vomiting)    Past Surgical History:  Past Surgical History:  Procedure Laterality Date   BREAST BIOPSY Left    CESAREAN SECTION     X 2   HEMORROIDECTOMY     INGUINAL HERNIA REPAIR Bilateral 02/23/2022   Procedure: LAPAROSCOPIC INGUINAL HERNIA;  Surgeon: Harriette Bouillon, MD;  Location: [facility] OR;  Service: General;  Laterality: Bilateral;   SHOULDER SURGERY Left    Pt had limited movement with left shoulder/arm   THYROID CYST EXCISION      Social History:  reports that she has quit smoking. She has never used smokeless tobacco. She reports current alcohol use of about 2.0 - 3.0 standard drinks of alcohol per week. She reports that she does not use drugs. Family History: family history includes Dementia in her father; Stroke in her mother.   HOME MEDICATIONS: Allergies as of  12/26/2022       Reactions   Augmentin [amoxicillin-pot Clavulanate] Diarrhea, Nausea And Vomiting   Codeine Nausea And Vomiting        Medication List        Accurate as of December 26, 2022  2:38 PM. If you have any questions, ask your nurse or doctor.          STOP taking these medications    acetaminophen 500 MG tablet Commonly known as: TYLENOL Stopped by: Scarlette Shorts, MD   linaclotide 72 MCG capsule Commonly known as: Linzess Stopped by: Scarlette Shorts, MD   ondansetron 4 MG disintegrating tablet Commonly known as: ZOFRAN-ODT Stopped by: Scarlette Shorts, MD   valACYclovir 1000 MG tablet Commonly known as: VALTREX Stopped by: Scarlette Shorts, MD       TAKE these medications    alendronate 70 MG tablet Commonly known as: FOSAMAX TAKE 1 TABLET BY MOUTH ONCE A WEEK . APPOINTMENT REQUIRED FOR FUTURE REFILLS   cyanocobalamin 1000 MCG tablet Commonly known as: VITAMIN B12 Take by mouth.   D3 PO Take 1 capsule by mouth daily.   docusate sodium 100 MG capsule Commonly known as: COLACE Take 1 capsule (100 mg total) by mouth 3 (three) times daily as needed for mild constipation.   levothyroxine 75 MCG tablet Commonly known as: SYNTHROID Take 1 tablet (75 mcg  total) by mouth See admin instructions. 75 mcg every day EXCEPT Saturday   lisinopril 30 MG tablet Commonly known as: ZESTRIL Take 1 tablet by mouth once daily for blood pressure   lovastatin 40 MG tablet Commonly known as: MEVACOR Take 1 tablet by mouth once daily          REVIEW OF SYSTEMS: A comprehensive ROS was conducted with the patient and is negative except as per HPI    OBJECTIVE:  VS: BP 134/76 (BP Location: Left Arm, Patient Position: Sitting, Cuff Size: Large)   Pulse 65   Ht 5' (1.524 m)   Wt 110 lb (49.9 kg)   SpO2 99%   BMI 21.48 kg/m    Wt Readings from Last 3 Encounters:  12/26/22 110 lb (49.9 kg)  12/08/22 108 lb (49 kg)  10/16/22 109 lb (49.4  kg)     EXAM: General: Pt appears well and is in NAD  Eyes: External eye exam normal without stare, lid lag or exophthalmos.  EOM intact.   Neck: General: Supple without adenopathy. Thyroid: Thyroid size normal.  No goiter or nodules appreciated.   Lungs: Clear with good BS bilat   Heart: Auscultation: RRR.  Abdomen: Soft, nontender  Extremities:  BL LE: No pretibial edema   Mental Status: Judgment, insight: Intact Orientation: Oriented to time, place, and person Mood and affect: No depression, anxiety, or agitation     DATA REVIEWED: ***    ASSESSMENT/PLAN/RECOMMENDATIONS:   Hyponatremia:   -High suspicion for SIADH -Discussed importance of limiting fluid intake -Serum sodium goal ~130 -Will proceed with serum/urine osmolality, urine sodium   Recommendations Limit fluid intake 50 ounce daily Repeat labs in 2 months  Signed electronically by: Lyndle Herrlich, MD  [facility] Endocrinology  [facility] [address] [place], [place] [address] Phone: [phone] FAX: [phone]   CC: Mort Sawyers, FNP [address] [place] [place] [place] [address] Phone: [phone] Fax: [phone]   Return to Endocrinology clinic as below: Future Appointments  Date Time Provider Department Center  04/18/2023  8:40 AM Mort Sawyers, FNP [facility]

## 2022-12-26 NOTE — Patient Instructions (Signed)
Please limit all liquid intake to 50 oz daily ( which is 6 cups a day ) to include water, coffee, tea , juice , soup etc

## 2022-12-27 LAB — OSMOLALITY, URINE: Osmolality, Ur: 369 mOsm/kg (ref 50–1200)

## 2022-12-27 LAB — COMPREHENSIVE METABOLIC PANEL
ALT: 13 U/L (ref 0–35)
AST: 22 U/L (ref 0–37)
Albumin: 4.7 g/dL (ref 3.5–5.2)
Alkaline Phosphatase: 53 U/L (ref 39–117)
BUN: 19 mg/dL (ref 6–23)
CO2: 29 mEq/L (ref 19–32)
Calcium: 10.3 mg/dL (ref 8.4–10.5)
Chloride: 94 mEq/L — ABNORMAL LOW (ref 96–112)
Creatinine, Ser: 0.97 mg/dL (ref 0.40–1.20)
GFR: 57.77 mL/min — ABNORMAL LOW (ref >60.00)
Glucose, Bld: 75 mg/dL (ref 70–99)
Potassium: 4.3 mEq/L (ref 3.5–5.1)
Sodium: 132 mEq/L — ABNORMAL LOW (ref 135–145)
Total Bilirubin: 0.5 mg/dL (ref 0.2–1.2)
Total Protein: 7.6 g/dL (ref 6.0–8.3)

## 2022-12-27 LAB — SODIUM, URINE, RANDOM: Sodium, Ur: 14 mmol/L — ABNORMAL LOW (ref 28–272)

## 2022-12-27 LAB — TSH: TSH: 3.62 u[IU]/mL (ref 0.35–5.50)

## 2022-12-27 LAB — OSMOLALITY: Osmolality: 279 mOsm/kg (ref 278–305)

## 2023-01-14 ENCOUNTER — Other Ambulatory Visit: Payer: Self-pay | Admitting: Family

## 2023-01-17 ENCOUNTER — Other Ambulatory Visit: Payer: Self-pay | Admitting: *Deleted

## 2023-01-17 NOTE — Telephone Encounter (Signed)
Refill was sent to [facility] 01/15/2023 for #[phone] with no refills.   Now receiving request from [employer].

## 2023-01-19 MED ORDER — LOVASTATIN 40 MG PO TABS: ORAL_TABLET | ORAL | 3 refills | Status: DC

## 2023-01-30 ENCOUNTER — Ambulatory Visit: Payer: Medicare PPO | Admitting: Internal Medicine

## 2023-02-24 ENCOUNTER — Other Ambulatory Visit: Payer: Self-pay | Admitting: Family

## 2023-03-13 ENCOUNTER — Ambulatory Visit: Payer: Medicare PPO | Admitting: Internal Medicine

## 2023-03-13 ENCOUNTER — Encounter: Payer: Self-pay | Admitting: Internal Medicine

## 2023-03-13 VITALS — BP 110/70 | HR 61 | Ht 60.0 in | Wt 108.0 lb

## 2023-03-13 DIAGNOSIS — E871 Hypo-osmolality and hyponatremia: Secondary | ICD-10-CM | POA: Diagnosis not present

## 2023-03-13 LAB — BASIC METABOLIC PANEL
BUN: 10 mg/dL (ref 6–23)
CO2: 31 mEq/L (ref 19–32)
Calcium: 9.2 mg/dL (ref 8.4–10.5)
Chloride: 97 mEq/L (ref 96–112)
Creatinine, Ser: 0.59 mg/dL (ref 0.40–1.20)
GFR: 88.91 mL/min (ref >60.00)
Glucose, Bld: 83 mg/dL (ref 70–99)
Potassium: 5 mEq/L (ref 3.5–5.1)
Sodium: 133 mEq/L — ABNORMAL LOW (ref 135–145)

## 2023-03-13 LAB — CORTISOL: Cortisol, Plasma: 6.7 ug/dL

## 2023-03-13 NOTE — Patient Instructions (Signed)
Please limit all liquid intake to 50 oz daily ( which is 6 cups a day ) to include water, coffee, tea , juice , soup etc

## 2023-03-13 NOTE — Progress Notes (Unsigned)
Name: Megan Stokes  MRN/ DOB: [id number], [birth year]    Age/ Sex: 72 y.o., female    PCP: Mort Sawyers, FNP   Reason for Endocrinology Evaluation: Hyponatremia     Date of Initial Endocrinology Evaluation: 12/26/2022    HPI: [name] is a 72 y.o. female with a past medical history of hypothyroid, HTN, dyslipidemia. The patient presented for initial endocrinology clinic visit on 12/26/2022 for consultative assistance with her hyponatremia.   Patient has been noted with hyponatremia since 2015, with a nadir of 129 mEq/L 03/2022  She has been on  levothyroxine for years    No diuretic use  No antihistamine or PPI  She drinks decaf coffee  (x2) and water and diet coke( ~ 16 oz)    SUBJECTIVE:    Today (03/13/23):  Megan Stokes is here for follow-up on hyponatremia.  She has been limiting fluid intake to less than 50 ounce , she continues with coffee and a glass of wine Weight stable Denies LE edema  No dizziness  Today she is having sciatica pain, took  left over pain meds which results in nausea and dizziness  Denies constipation r diarrhea  Nocturia 2-3 x a day    No CHF or cihorris , has been told she has a murmur     HISTORY:  Past Medical History:  Past Medical History:  Diagnosis Date   Hypertension    Hypothyroidism    PONV (postoperative nausea and vomiting)    Past Surgical History:  Past Surgical History:  Procedure Laterality Date   BREAST BIOPSY Left    CESAREAN SECTION     X 2   HEMORROIDECTOMY     INGUINAL HERNIA REPAIR Bilateral 02/23/2022   Procedure: LAPAROSCOPIC INGUINAL HERNIA;  Surgeon: Harriette Bouillon, MD;  Location: [facility] OR;  Service: General;  Laterality: Bilateral;   SHOULDER SURGERY Left    Pt had limited movement with left shoulder/arm   THYROID CYST EXCISION      Social History:  reports that she has quit smoking. She has never used smokeless tobacco. She reports current alcohol use of about 2.0 - 3.0 standard  drinks of alcohol per week. She reports that she does not use drugs. Family History: family history includes Dementia in her father; Stroke in her mother.   HOME MEDICATIONS: Allergies as of 03/13/2023       Reactions   Augmentin [amoxicillin-pot Clavulanate] Diarrhea, Nausea And Vomiting   Codeine Nausea And Vomiting        Medication List        Accurate as of March 13, 2023 10:55 AM. If you have any questions, ask your nurse or doctor.          alendronate 70 MG tablet Commonly known as: FOSAMAX TAKE 1 TABLET BY MOUTH ONCE A WEEK . APPOINTMENT REQUIRED FOR FUTURE REFILLS   cyanocobalamin 1000 MCG tablet Commonly known as: VITAMIN B12 Take by mouth.   B-12 50 MCG Tabs   D3 PO Take 1 capsule by mouth daily.   Vitamin D3 50 MCG (2000 UT) capsule   docusate sodium 100 MG capsule Commonly known as: COLACE Take 1 capsule (100 mg total) by mouth 3 (three) times daily as needed for mild constipation.   levothyroxine 75 MCG tablet Commonly known as: SYNTHROID Take 1 tablet (75 mcg total) by mouth See admin instructions. 75 mcg every day EXCEPT Saturday   lisinopril 30 MG tablet Commonly known as: ZESTRIL Take 1  tablet by mouth once daily for blood pressure   lovastatin 40 MG tablet Commonly known as: MEVACOR TAKE 1 TABLET BY MOUTH ONCE DAILY          REVIEW OF SYSTEMS: A comprehensive ROS was conducted with the patient and is negative except as per HPI    OBJECTIVE:  VS: BP 110/70 (BP Location: Left Arm, Patient Position: Sitting, Cuff Size: Small)   Pulse 61   Ht 5' (1.524 m)   Wt 108 lb (49 kg)   SpO2 98%   BMI 21.09 kg/m    Wt Readings from Last 3 Encounters:  03/13/23 108 lb (49 kg)  12/26/22 110 lb (49.9 kg)  12/08/22 108 lb (49 kg)     EXAM: General: Pt appears well and is in NAD  Neck: General: Supple without adenopathy. Thyroid: Thyroid size normal.  No goiter or nodules appreciated.   Lungs: Clear with good BS bilat   Heart:  Auscultation: RRR.  Abdomen: Soft, nontender  Extremities:  BL LE: No pretibial edema   Mental Status: Judgment, insight: Intact Orientation: Oriented to time, place, and person Mood and affect: No depression, anxiety, or agitation     DATA REVIEWED:    Latest Reference Range & Units 12/26/22 15:03  Sodium 135 - 145 mEq/L 132 (L)  Potassium 3.5 - 5.1 mEq/L 4.3  Chloride 96 - 112 mEq/L 94 (L)  CO2 19 - 32 mEq/L 29  Glucose 70 - 99 mg/dL 75  BUN 6 - 23 mg/dL 19  Creatinine 5.63 - 8.75 mg/dL 6.43  Calcium 8.4 - 32.9 mg/dL 51.8  Alkaline Phosphatase 39 - 117 U/L 53  Albumin 3.5 - 5.2 g/dL 4.7  AST 0 - 37 U/L 22  ALT 0 - 35 U/L 13  Total Protein 6.0 - 8.3 g/dL 7.6  Total Bilirubin 0.2 - 1.2 mg/dL 0.5  GFR >84.16 mL/min 57.77 (L)    Latest Reference Range & Units 12/26/22 15:03  Osmolality 278 - 305 mOsm/kg 279      Latest Reference Range & Units 12/26/22 15:03  TSH 0.35 - 5.50 uIU/mL 3.62    Latest Reference Range & Units 12/26/22 15:04  Osmolality, Urine 50 - 1,200 mOsm/kg 369  Sodium, Urine 28 - 272 mmol/L 14 (L)  (L): Data is abnormally low  ASSESSMENT/PLAN/RECOMMENDATIONS:   Hyponatremia:   -Serum sodium is improving -Her serum osmolality is normal, with a low urine sodium 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  The patient has no prior history of CHF, cirrhosis, and denies excessive alcohol use -Discussed importance of limiting fluid intake 50 ounce daily -Serum sodium goal ~130    Recommendations Limit fluid intake 50 ounce daily  .   Signed electronically by: Lyndle Herrlich, MD  [facility] Endocrinology  [facility] [address] [place], [place] [address] Phone: [phone] FAX: [phone]   CC: Mort Sawyers, FNP [address] [place] [place] [address] Phone: [phone] Fax:  [phone]   Return to Endocrinology clinic as below: Future Appointments  Date Time Provider Department Center  04/18/2023  8:40 AM Mort Sawyers, FNP [facility]

## 2023-03-15 LAB — OSMOLALITY: Osmolality: 275 mOsm/kg — ABNORMAL LOW (ref 278–305)

## 2023-03-15 LAB — OSMOLALITY, URINE: Osmolality, Ur: 247 mOsm/kg (ref 50–1200)

## 2023-03-15 LAB — SODIUM, URINE, RANDOM: Sodium, Ur: 33 mmol/L (ref 28–272)

## 2023-03-19 LAB — ACTH: C206 ACTH: 12 pg/mL (ref 6–50)

## 2023-04-18 ENCOUNTER — Encounter: Payer: Medicare PPO | Admitting: Family

## 2023-04-23 ENCOUNTER — Ambulatory Visit: Payer: Medicare PPO | Admitting: Internal Medicine

## 2023-04-25 ENCOUNTER — Encounter: Payer: Self-pay | Admitting: Family

## 2023-04-25 ENCOUNTER — Ambulatory Visit: Payer: Medicare PPO | Admitting: Family

## 2023-04-25 VITALS — BP 136/82 | HR 66 | Temp 98.0°F | Ht 60.0 in | Wt 105.0 lb

## 2023-04-25 DIAGNOSIS — D649 Anemia, unspecified: Secondary | ICD-10-CM | POA: Diagnosis not present

## 2023-04-25 DIAGNOSIS — E039 Hypothyroidism, unspecified: Secondary | ICD-10-CM | POA: Diagnosis not present

## 2023-04-25 DIAGNOSIS — R011 Cardiac murmur, unspecified: Secondary | ICD-10-CM

## 2023-04-25 DIAGNOSIS — Z23 Encounter for immunization: Secondary | ICD-10-CM | POA: Diagnosis not present

## 2023-04-25 DIAGNOSIS — K644 Residual hemorrhoidal skin tags: Secondary | ICD-10-CM | POA: Diagnosis not present

## 2023-04-25 DIAGNOSIS — I10 Essential (primary) hypertension: Secondary | ICD-10-CM

## 2023-04-25 DIAGNOSIS — E7800 Pure hypercholesterolemia, unspecified: Secondary | ICD-10-CM | POA: Diagnosis not present

## 2023-04-25 DIAGNOSIS — Z0000 Encounter for general adult medical examination without abnormal findings: Secondary | ICD-10-CM

## 2023-04-25 DIAGNOSIS — Z79899 Other long term (current) drug therapy: Secondary | ICD-10-CM

## 2023-04-25 DIAGNOSIS — E871 Hypo-osmolality and hyponatremia: Secondary | ICD-10-CM

## 2023-04-25 DIAGNOSIS — K648 Other hemorrhoids: Secondary | ICD-10-CM

## 2023-04-25 DIAGNOSIS — M810 Age-related osteoporosis without current pathological fracture: Secondary | ICD-10-CM

## 2023-04-25 LAB — LIPID PANEL
Cholesterol: 200 mg/dL (ref 0–200)
HDL: 88.4 mg/dL (ref >39.00)
LDL Cholesterol: 99 mg/dL (ref 0–99)
NonHDL: 111.23
Total CHOL/HDL Ratio: 2
Triglycerides: 60 mg/dL (ref 0.0–149.0)
VLDL: 12 mg/dL (ref 0.0–40.0)

## 2023-04-25 LAB — CBC
HCT: 35.1 % — ABNORMAL LOW (ref 36.0–46.0)
Hemoglobin: 11.8 g/dL — ABNORMAL LOW (ref 12.0–15.0)
MCHC: 33.5 g/dL (ref 30.0–36.0)
MCV: 103.1 fL — ABNORMAL HIGH (ref 78.0–100.0)
Platelets: 317 10*3/uL (ref 150.0–400.0)
RBC: 3.41 Mil/uL — ABNORMAL LOW (ref 3.87–5.11)
RDW: 12.3 % (ref 11.5–15.5)
WBC: 4.5 10*3/uL (ref 4.0–10.5)

## 2023-04-25 LAB — COMPREHENSIVE METABOLIC PANEL
ALT: 11 U/L (ref 0–35)
AST: 19 U/L (ref 0–37)
Albumin: 4.7 g/dL (ref 3.5–5.2)
Alkaline Phosphatase: 54 U/L (ref 39–117)
BUN: 15 mg/dL (ref 6–23)
CO2: 31 meq/L (ref 19–32)
Calcium: 9.7 mg/dL (ref 8.4–10.5)
Chloride: 97 meq/L (ref 96–112)
Creatinine, Ser: 0.67 mg/dL (ref 0.40–1.20)
GFR: 86.15 mL/min (ref >60.00)
Glucose, Bld: 87 mg/dL (ref 70–99)
Potassium: 4.8 meq/L (ref 3.5–5.1)
Sodium: 134 meq/L — ABNORMAL LOW (ref 135–145)
Total Bilirubin: 0.8 mg/dL (ref 0.2–1.2)
Total Protein: 7.5 g/dL (ref 6.0–8.3)

## 2023-04-25 MED ORDER — HYDROCORTISONE (PERIANAL) 2.5 % EX CREA: 1.0000 | TOPICAL_CREAM | Freq: Two times a day (BID) | CUTANEOUS | 0 refills | Status: DC

## 2023-04-25 MED ORDER — PROCTOFOAM HC 1-1 % EX FOAM: 1.0000 | Freq: Two times a day (BID) | CUTANEOUS | 0 refills | Status: DC

## 2023-04-25 NOTE — Progress Notes (Signed)
Subjective:  Patient ID: Megan Stokes, female    DOB: 1950-10-30  Age: 72 y.o. MRN: [id number]  Patient Care Team: Mort Sawyers, FNP as PCP - General (Family Medicine) Kaleen Mask, MD (Family Medicine)   CC:  Chief Complaint  Patient presents with   Annual Exam    HPI SUMI AX is a 72 y.o. female who presents today for an annual physical exam. She reports consuming a general diet. Home exercise routine includes walks [id number] steps every morning. She generally feels well. She reports sleeping well. She does have additional problems to discuss today.   Vision:Within last year Dental:Receives regular dental care  Mammogram: 07/11/22 Colonoscopy: 10/28/2018, completed. Bone density scan:07/11/22 , with osteoporosis  Pt is with acute concerns.  External hemorrhoids, lately noticing blood on the toilet paper some here and there. Is not often constipated but at times stomach will get upset, and will have to go to bathroom 2-3 times that can cause her irritation as well. She can feel and see the hemorrhoids, not currently swollen. A few months ago had a moment where they were swollen and tender and then had more blood on toilet paper than usual but not since.       Advanced Directives Patient does not have advanced directives.She does not have a copy in the electronic medical record.   DEPRESSION SCREENING    04/25/2023   10:21 AM 12/08/2022   10:13 AM 10/16/2022    9:32 AM 09/07/2022    9:57 AM 07/17/2022   10:18 AM 01/16/2022   11:09 AM  PHQ 2/9 Scores  PHQ - 2 Score 0 0 0 2 0 0  PHQ- 9 Score 0 3  4 2 2      ROS: Negative unless specifically indicated above in HPI.    Current Outpatient Medications:    alendronate (FOSAMAX) 70 MG tablet, TAKE 1 TABLET BY MOUTH ONCE A WEEK . APPOINTMENT REQUIRED FOR FUTURE REFILLS, Disp: 12 tablet, Rfl: 0   Cholecalciferol (D3 PO), Take 1 capsule by mouth daily., Disp: , Rfl:    Cholecalciferol (VITAMIN D3) 50 MCG (2000 UT)  capsule, , Disp: , Rfl:    cyanocobalamin (VITAMIN B12) 1000 MCG tablet, Take by mouth., Disp: , Rfl:    docusate sodium (COLACE) 100 MG capsule, Take 1 capsule (100 mg total) by mouth 3 (three) times daily as needed for mild constipation., Disp: , Rfl:    hydrocortisone (ANUSOL-HC) 2.5 % rectal cream, Place 1 Application rectally 2 (two) times daily., Disp: 30 g, Rfl: 0   hydrocortisone-pramoxine (PROCTOFOAM HC) rectal foam, Place 1 applicator rectally 2 (two) times daily., Disp: 10 g, Rfl: 0   levothyroxine (SYNTHROID) 75 MCG tablet, Take 1 tablet (75 mcg total) by mouth See admin instructions. 75 mcg every day EXCEPT Saturday, Disp: 90 tablet, Rfl: 3   lisinopril (ZESTRIL) 30 MG tablet, Take 1 tablet by mouth once daily for blood pressure, Disp: 90 tablet, Rfl: 3   lovastatin (MEVACOR) 40 MG tablet, TAKE 1 TABLET BY MOUTH ONCE DAILY, Disp: 90 tablet, Rfl: 3    Objective:    BP 136/82 (BP Location: Left Arm, Patient Position: Sitting, Cuff Size: Normal)   Pulse 66   Temp 98 F (36.7 C) (Temporal)   Ht 5' (1.524 m)   Wt 105 lb (47.6 kg)   SpO2 98%   BMI 20.51 kg/m   BP Readings from Last 3 Encounters:  04/25/23 136/82  03/13/23 110/70  12/26/22 134/76  Physical Exam Constitutional:      General: She is not in acute distress.    Appearance: Normal appearance. She is normal weight. She is not ill-appearing.  HENT:     Head: Normocephalic.     Right Ear: Tympanic membrane normal.     Left Ear: Tympanic membrane normal.     Nose: Nose normal.     Mouth/Throat:     Mouth: Mucous membranes are moist.  Eyes:     Extraocular Movements: Extraocular movements intact.     Pupils: Pupils are equal, round, and reactive to light.  Cardiovascular:     Rate and Rhythm: Normal rate and regular rhythm.     Heart sounds: Murmur heard.  Pulmonary:     Effort: Pulmonary effort is normal.     Breath sounds: Normal breath sounds.  Abdominal:     General: Abdomen is flat. Bowel sounds  are normal.     Palpations: Abdomen is soft.     Tenderness: There is no guarding or rebound.  Musculoskeletal:        General: Normal range of motion.     Cervical back: Normal range of motion.  Skin:    General: Skin is warm.     Capillary Refill: Capillary refill takes less than 2 seconds.  Neurological:     General: No focal deficit present.     Mental Status: She is alert.  Psychiatric:        Mood and Affect: Mood normal.        Behavior: Behavior normal.        Thought Content: Thought content normal.        Judgment: Judgment normal.          Assessment & Plan:  Encounter for immunization -     Flu Vaccine Trivalent High Dose (Fluad)  External hemorrhoids -     Hydrocortisone (Perianal); Place 1 Application rectally 2 (two) times daily.  Dispense: 30 g; Refill: 0 -     Proctofoam HC; Place 1 applicator rectally 2 (two) times daily.  Dispense: 10 g; Refill: 0  Acquired hypothyroidism  On statin therapy  Hyponatremia Assessment & Plan: Stable will continue to monitor.   Orders: -     Comprehensive metabolic panel  Hypercholesterolemia -     Lipid panel  Anemia, unspecified type -     CBC  Other hemorrhoids Assessment & Plan: Controlled at current however with flare rx anusol cream and also proctofoam for internal. Try to avoid straining, colace as needed.  Sitz baths prn.   Age-related osteoporosis without current pathological fracture Assessment & Plan: Three years on fosamax, tolerating well.  Continue weekly, also take daily calcium and vitamin D and work on light weight bearing exercises. Dexa due 2026.   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  Strict management and control of HLD and HTN in place. Monitoring lipid panel periodically. Continue lovastatin 40 mg once daily.   Essential hypertension Assessment & Plan: Stable. At goal.  Continue lisinopril 30 mg once daily    Encounter for general adult  medical examination without abnormal findings Assessment & Plan: Patient Counseling(The following topics were reviewed):  Preventative care handout given to pt  Health maintenance and immunizations reviewed. Please refer to Health maintenance section. Pt advised on safe sex, wearing seatbelts in car, and proper nutrition labwork ordered today for annual Dental health: Discussed importance of regular tooth brushing, flossing, and dental visits.  Follow-up: Return in about 6 months (around 10/23/2023) for f/u cholesterol.   Mort Sawyers, FNP

## 2023-04-30 DIAGNOSIS — Z0000 Encounter for general adult medical examination without abnormal findings: Secondary | ICD-10-CM | POA: Insufficient documentation

## 2023-04-30 NOTE — Assessment & Plan Note (Signed)
Controlled at current however with flare rx anusol cream and also proctofoam for internal. Try to avoid straining, colace as needed.  Sitz baths prn.

## 2023-04-30 NOTE — Assessment & Plan Note (Signed)
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  Strict management and control of HLD and HTN in place. Monitoring lipid panel periodically. Continue lovastatin 40 mg once daily.

## 2023-04-30 NOTE — Assessment & Plan Note (Signed)
Stable--will continue to monitor

## 2023-04-30 NOTE — Assessment & Plan Note (Signed)

## 2023-04-30 NOTE — Assessment & Plan Note (Signed)
Stable. At goal.  Continue lisinopril 30 mg once daily

## 2023-04-30 NOTE — Assessment & Plan Note (Signed)
Three years on fosamax, tolerating well.  Continue weekly, also take daily calcium and vitamin D and work on light weight bearing exercises. Dexa due 2026.

## 2023-05-12 ENCOUNTER — Other Ambulatory Visit: Payer: Self-pay | Admitting: Family

## 2023-05-28 ENCOUNTER — Ambulatory Visit (INDEPENDENT_AMBULATORY_CARE_PROVIDER_SITE_OTHER): Payer: Medicare PPO

## 2023-05-28 VITALS — Ht 60.0 in | Wt 105.0 lb

## 2023-05-28 DIAGNOSIS — Z0000 Encounter for general adult medical examination without abnormal findings: Secondary | ICD-10-CM

## 2023-05-28 NOTE — Patient Instructions (Signed)
Megan Stokes , Thank you for taking time to come for your Medicare Wellness Visit. I appreciate your ongoing commitment to your health goals. Please review the following plan we discussed and let me know if I can assist you in the future.   Referrals/Orders/Follow-Ups/Clinician Recommendations: none  This is a list of the screening recommended for you and due dates:  Health Maintenance  Topic Date Due   COVID-19 Vaccine (5 - 2023-24 season) 02/18/2023   Hepatitis C Screening  07/18/2023*   Medicare Annual Wellness Visit  05/27/2024   Mammogram  07/11/2024   Pneumonia Vaccine  Completed   Flu Shot  Completed   DEXA scan (bone density measurement)  Completed   Zoster (Shingles) Vaccine  Completed   HPV Vaccine  Aged Out   DTaP/Tdap/Td vaccine  Discontinued   Colon Cancer Screening  Discontinued  *Topic was postponed. The date shown is not the original due date.    Advanced directives: (Copy Requested) Please bring a copy of your health care power of attorney and living will to the office to be added to your chart at your convenience.  Next Medicare Annual Wellness Visit scheduled for next year: Yes 05/29/23 @ 1:40pm telephone

## 2023-05-28 NOTE — Progress Notes (Signed)
Subjective:   Megan Stokes is a 72 y.o. female who presents for Medicare Annual (Subsequent) preventive examination.  Visit Complete: Virtual I connected with  Megan Stokes on 05/28/23 by a audio enabled telemedicine application and verified that I am speaking with the correct person using two identifiers.  Patient Location: Home  Provider Location: Office/Clinic  I discussed the limitations of evaluation and management by telemedicine. The patient expressed understanding and agreed to proceed.  Vital Signs: Because this visit was a virtual/telehealth visit, some criteria may be missing or patient reported. Any vitals not documented were not able to be obtained and vitals that have been documented are patient reported.  Patient Medicare AWV questionnaire was completed by the patient on (not done); I have confirmed that all information answered by patient is correct and no changes since this date. Cardiac Risk Factors include: advanced age (>89men, >84 women);dyslipidemia;hypertension    Objective:    Today's Vitals   05/28/23 1405 05/28/23 1406  Weight: 105 lb (47.6 kg)   Height: 5' (1.524 m)   PainSc:  5    Body mass index is 20.51 kg/m.     05/28/2023    2:15 PM 02/23/2022    8:33 AM 02/21/2022    6:57 PM 01/16/2022   11:04 AM 02/12/2014    8:42 PM 02/05/2014    3:24 PM  Advanced Directives  Does Patient Have a Medical Advance Directive? Yes No No Yes Yes Yes  Type of Estate agent of [place];Living will   Living will Living will Living will  Does patient want to make changes to medical advance directive?     No - Patient declined   Copy of Healthcare Power of Attorney in Chart? No - copy requested    No - copy requested No - copy requested  Would patient like information on creating a medical advance directive?  No - Patient declined No - Patient declined       Current Medications (verified) Outpatient Encounter Medications as of 05/28/2023   Medication Sig   alendronate (FOSAMAX) 70 MG tablet Take 1 tablet (70 mg total) by mouth once a week.   Cholecalciferol (D3 PO) Take 1 capsule by mouth daily.   Cholecalciferol (VITAMIN D3) 50 MCG (2000 UT) capsule    cyanocobalamin (VITAMIN B12) 1000 MCG tablet Take by mouth.   docusate sodium (COLACE) 100 MG capsule Take 1 capsule (100 mg total) by mouth 3 (three) times daily as needed for mild constipation.   hydrocortisone (ANUSOL-HC) 2.5 % rectal cream Place 1 Application rectally 2 (two) times daily.   hydrocortisone-pramoxine (PROCTOFOAM HC) rectal foam Place 1 applicator rectally 2 (two) times daily.   levothyroxine (SYNTHROID) 75 MCG tablet Take 1 tablet (75 mcg total) by mouth See admin instructions. 75 mcg every day EXCEPT Saturday   lisinopril (ZESTRIL) 30 MG tablet Take 1 tablet by mouth once daily for blood pressure   lovastatin (MEVACOR) 40 MG tablet TAKE 1 TABLET BY MOUTH ONCE DAILY   No facility-administered encounter medications on file as of 05/28/2023.    Allergies (verified) Augmentin [amoxicillin-pot clavulanate] and Codeine   History: Past Medical History:  Diagnosis Date   Hypertension    Hypothyroidism    PONV (postoperative nausea and vomiting)    Past Surgical History:  Procedure Laterality Date   BREAST BIOPSY Left    CESAREAN SECTION     X 2   HEMORROIDECTOMY     INGUINAL HERNIA REPAIR Bilateral 02/23/2022  Procedure: LAPAROSCOPIC INGUINAL HERNIA;  Surgeon: Harriette Bouillon, MD;  Location: [facility] OR;  Service: General;  Laterality: Bilateral;   SHOULDER SURGERY Left    Pt had limited movement with left shoulder/arm   THYROID CYST EXCISION     Family History  Problem Relation Age of Onset   Stroke Mother    Dementia Father    Social History   Socioeconomic History   Marital status: Divorced    Spouse name: Not on file   Number of children: 3   Years of education: high school   Highest education level: Not on file  Occupational History   Not on  file  Tobacco Use   Smoking status: Former    Types: Cigarettes   Smokeless tobacco: Never   Tobacco comments:    H/o 5 year smoking history   Vaping Use   Vaping status: Never Used  Substance and Sexual Activity   Alcohol use: Yes    Alcohol/week: 2.0 - 3.0 standard drinks of alcohol    Types: 2 - 3 Glasses of wine per week    Comment: 2 glasses nightly   Drug use: No   Sexual activity: Not Currently  Other Topics Concern   Not on file  Social History Narrative   09/21/21   From: the area   Living: alone   Work: retired - Chartered certified accountant, Geophysicist/field seismologist      Family: 3 sons - Pennie Rushing, Dennis, Anette Riedel - no grandchildren      Enjoys: work in the yard, cooking, walking      Exercise: walking 5 miles daily   Diet: pretty good      IT sales professional belts: Yes    Guns: No   Safe in relationships: Yes       Social Determinants of Corporate investment banker Strain: Low Risk  (05/28/2023)   Overall Financial Resource Strain (CARDIA)    Difficulty of Paying Living Expenses: Not hard at all  Food Insecurity: No Food Insecurity (05/28/2023)   Hunger Vital Sign    Worried About Running Out of Food in the Last Year: Never true    Ran Out of Food in the Last Year: Never true  Transportation Needs: No Transportation Needs (05/28/2023)   PRAPARE - Administrator, Civil Service (Medical): No    Lack of Transportation (Non-Medical): No  Physical Activity: Sufficiently Active (05/28/2023)   Exercise Vital Sign    Days of Exercise per Week: 5 days    Minutes of Exercise per Session: 60 min  Stress: No Stress Concern Present (05/28/2023)   [employer] of Occupational Health - Occupational Stress Questionnaire    Feeling of Stress : Not at all  Social Connections: Moderately Isolated (05/28/2023)   Social Connection and Isolation Panel [NHANES]    Frequency of Communication with Friends and Family: More than three times a week    Frequency of Social Gatherings with Friends and Family: More  than three times a week    Attends Religious Services: More than 4 times per year    Active Member of [employer] or Organizations: No    Attends Engineer, structural: Never    Marital Status: Divorced    Tobacco Counseling Counseling given: Not Answered Tobacco comments: H/o 5 year smoking history    Clinical Intake:  Pre-visit preparation completed: No  Pain : 0-10 Pain Score: 5  Pain Location: Back Pain Orientation: Lower Pain Descriptors / Indicators: Aching Pain Onset: More than a month  ago Pain Frequency: Intermittent     BMI - recorded: 20.51 Nutritional Status: BMI of 19-24  Normal Nutritional Risks: None Diabetes: No  How often do you need to have someone help you when you read instructions, pamphlets, or other written materials from your doctor or pharmacy?: 1 - Never  Interpreter Needed?: No  Comments: lives alone Information entered by :: B.Prerna Harold,LPN   Activities of Daily Living    05/28/2023    2:17 PM 12/08/2022   10:14 AM  In your present state of health, do you have any difficulty performing the following activities:  Hearing? 0 0  Vision? 0 0  Difficulty concentrating or making decisions? 0 0  Walking or climbing stairs? 0 0  Dressing or bathing? 0 0  Doing errands, shopping? 0 0  Preparing Food and eating ? N   Using the Toilet? N   In the past six months, have you accidently leaked urine? N   Do you have problems with loss of bowel control? N   Managing your Medications? N   Managing your Finances? N   Housekeeping or managing your Housekeeping? N     Patient Care Team: Mort Sawyers, FNP as PCP - General (Family Medicine) Kaleen Mask, MD (Family Medicine)  Indicate any recent Medical Services you may have received from other than [facility] providers in the past year (date may be approximate).     Assessment:   This is a routine wellness examination for Megan Stokes.  Hearing/Vision screen Hearing Screening - Comments:: Pt  says she hears good Vision Screening - Comments:: Pt says her vision is good;readers only [facility] [place]   Goals Addressed             This Visit's Progress    Patient Stated   On track    Stay health       Depression Screen    05/28/2023    2:13 PM 04/25/2023   10:21 AM 12/08/2022   10:13 AM 10/16/2022    9:32 AM 09/07/2022    9:57 AM 07/17/2022   10:18 AM 01/16/2022   11:09 AM  PHQ 2/9 Scores  PHQ - 2 Score 0 0 0 0 2 0 0  PHQ- 9 Score  0 3  4 2 2     Fall Risk    05/28/2023    2:09 PM 04/25/2023   10:21 AM 12/08/2022   10:14 AM 10/16/2022    9:31 AM 09/07/2022    9:57 AM  Fall Risk   Falls in the past year? 0 0 0 0 0  Number falls in past yr: 0 0 0 0 0  Injury with Fall? 0 0 0 0 0  Risk for fall due to : No Fall Risks No Fall Risks No Fall Risks    Follow up Falls prevention discussed;Education provided Falls evaluation completed Falls evaluation completed Falls evaluation completed;Education provided;Falls prevention discussed Falls evaluation completed;Education provided;Falls prevention discussed    MEDICARE RISK AT HOME: Medicare Risk at Home Any stairs in or around the home?: No If so, are there any without handrails?: No Home free of loose throw rugs in walkways, pet beds, electrical cords, etc?: Yes Adequate lighting in your home to reduce risk of falls?: Yes Life alert?: No Use of a cane, walker or w/c?: No Grab bars in the bathroom?: Yes Shower chair or bench in shower?: No Elevated toilet seat or a handicapped toilet?: Yes  TIMED UP AND GO:  Was the test performed?  No  Cognitive Function:        05/28/2023    2:19 PM 01/16/2022   11:06 AM  6CIT Screen  What Year? 0 points 0 points  What month? 0 points 0 points  What time? 0 points 0 points  Count back from 20 0 points 0 points  Months in reverse 0 points 0 points  Repeat phrase 0 points 0 points  Total Score 0 points 0 points    Immunizations Immunization History  Administered  Date(s) Administered   Fluad Trivalent(High Dose 65+) 04/25/2023   Influenza,inj,quad, With Preservative 04/03/2019   PFIZER Comirnaty(Gray Top)Covid-19 Tri-Sucrose Vaccine 08/02/2019, 08/27/2019, 04/12/2020, 01/12/2021   PNEUMOCOCCAL CONJUGATE-20 05/17/2021   Pneumococcal Polysaccharide-23 04/02/2019   Zoster Recombinant(Shingrix) 04/02/2019, 07/10/2019    TDAP status: Up to date  Flu Vaccine status: Up to date  Pneumococcal vaccine status: Up to date  Covid-19 vaccine status: Completed vaccines  Qualifies for Shingles Vaccine? Yes   Zostavax completed Yes   Shingrix Completed?: Yes  Screening Tests Health Maintenance  Topic Date Due   COVID-19 Vaccine (5 - 2023-24 season) 06/13/2023 (Originally 02/18/2023)   Hepatitis C Screening  07/18/2023 (Originally 12/23/1968)   Medicare Annual Wellness (AWV)  05/27/2024   MAMMOGRAM  07/11/2024   Pneumonia Vaccine 8+ Years old  Completed   INFLUENZA VACCINE  Completed   DEXA SCAN  Completed   Zoster Vaccines- Shingrix  Completed   HPV VACCINES  Aged Out   DTaP/Tdap/Td  Discontinued   Colonoscopy  Discontinued    Health Maintenance  There are no preventive care reminders to display for this patient.   Colorectal cancer screening: No longer required.   Mammogram status: No longer required due to age.  Bone Density status: Completed 07/11/22. Results reflect: Bone density results: OSTEOPOROSIS. Repeat every 3 years.  Lung Cancer Screening: (Low Dose CT Chest recommended if Age 53-80 years, 20 pack-year currently smoking OR have quit w/in 15years.) does not qualify.   Lung Cancer Screening Referral: no  Additional Screening:  Hepatitis C Screening: does not qualify; Completed no  Vision Screening: Recommended annual ophthalmology exams for early detection of glaucoma and other disorders of the eye. Is the patient up to date with their annual eye exam?  Yes  Who is the provider or what is the name of the office in which the  patient attends annual eye exams? [facility] [place] If pt is not established with a provider, would they like to be referred to a provider to establish care? No .   Dental Screening: Recommended annual dental exams for proper oral hygiene  Diabetic Foot Exam: n/a  Community Resource Referral / Chronic Care Management: CRR required this visit?  No   CCM required this visit?  No     Plan:     I have personally reviewed and noted the following in the patient's chart:   Medical and social history Use of alcohol, tobacco or illicit drugs  Current medications and supplements including opioid prescriptions. Patient is not currently taking opioid prescriptions. Functional ability and status Nutritional status Physical activity Advanced directives List of other physicians Hospitalizations, surgeries, and ER visits in previous 12 months Vitals Screenings to include cognitive, depression, and falls Referrals and appointments  In addition, I have reviewed and discussed with patient certain preventive protocols, quality metrics, and best practice recommendations. A written personalized care plan for preventive services as well as general preventive health recommendations were provided to patient.     Sue Lush, LPN   34/12/4257  After Visit Summary: (MyChart) Due to this being a telephonic visit, the after visit summary with patients personalized plan was offered to patient via MyChart   Nurse Notes: The patient states she is doing well and has no concerns or questions at this time.

## 2023-06-04 ENCOUNTER — Emergency Department (HOSPITAL_COMMUNITY)
Admission: EM | Admit: 2023-06-04 | Discharge: 2023-06-05 | Disposition: A | Discharge status: Home / Self Care | Payer: Medicare PPO | Attending: Emergency Medicine | Admitting: Emergency Medicine

## 2023-06-04 ENCOUNTER — Emergency Department (HOSPITAL_COMMUNITY): Payer: Medicare PPO

## 2023-06-04 ENCOUNTER — Other Ambulatory Visit: Payer: Self-pay

## 2023-06-04 ENCOUNTER — Encounter (HOSPITAL_COMMUNITY): Payer: Self-pay

## 2023-06-04 DIAGNOSIS — M25521 Pain in right elbow: Secondary | ICD-10-CM | POA: Diagnosis not present

## 2023-06-04 DIAGNOSIS — S42114A Nondisplaced fracture of body of scapula, right shoulder, initial encounter for closed fracture: Secondary | ICD-10-CM | POA: Diagnosis not present

## 2023-06-04 DIAGNOSIS — Z79899 Other long term (current) drug therapy: Secondary | ICD-10-CM | POA: Diagnosis not present

## 2023-06-04 DIAGNOSIS — S0083XA Contusion of other part of head, initial encounter: Secondary | ICD-10-CM | POA: Diagnosis not present

## 2023-06-04 DIAGNOSIS — Y93K1 Activity, walking an animal: Secondary | ICD-10-CM | POA: Insufficient documentation

## 2023-06-04 DIAGNOSIS — R519 Headache, unspecified: Secondary | ICD-10-CM | POA: Diagnosis not present

## 2023-06-04 DIAGNOSIS — M19011 Primary osteoarthritis, right shoulder: Secondary | ICD-10-CM | POA: Diagnosis not present

## 2023-06-04 DIAGNOSIS — M129 Arthropathy, unspecified: Secondary | ICD-10-CM | POA: Diagnosis not present

## 2023-06-04 DIAGNOSIS — E039 Hypothyroidism, unspecified: Secondary | ICD-10-CM | POA: Insufficient documentation

## 2023-06-04 DIAGNOSIS — R509 Fever, unspecified: Secondary | ICD-10-CM | POA: Insufficient documentation

## 2023-06-04 DIAGNOSIS — M799 Soft tissue disorder, unspecified: Secondary | ICD-10-CM | POA: Diagnosis not present

## 2023-06-04 DIAGNOSIS — W19XXXA Unspecified fall, initial encounter: Secondary | ICD-10-CM | POA: Insufficient documentation

## 2023-06-04 DIAGNOSIS — M79631 Pain in right forearm: Secondary | ICD-10-CM | POA: Diagnosis not present

## 2023-06-04 DIAGNOSIS — I10 Essential (primary) hypertension: Secondary | ICD-10-CM | POA: Diagnosis not present

## 2023-06-04 DIAGNOSIS — S4991XA Unspecified injury of right shoulder and upper arm, initial encounter: Secondary | ICD-10-CM | POA: Diagnosis present

## 2023-06-04 DIAGNOSIS — M25511 Pain in right shoulder: Secondary | ICD-10-CM | POA: Diagnosis not present

## 2023-06-04 DIAGNOSIS — S43004A Unspecified dislocation of right shoulder joint, initial encounter: Secondary | ICD-10-CM | POA: Diagnosis not present

## 2023-06-04 MED ORDER — ACETAMINOPHEN 325 MG PO TABS
650.0000 mg | ORAL_TABLET | Freq: Once | ORAL | Status: AC
  Administered 2023-06-04: 650 mg via ORAL
  Filled 2023-06-04: qty 2

## 2023-06-04 NOTE — ED Triage Notes (Signed)
Pt arrived POV d/t Fall on right arm on ground.  Pt's dog pulled her down.  NOT on thinners did not have LOC did not hit head.  Pt c/o of right shoulder pain and right forearm pain.

## 2023-06-05 ENCOUNTER — Emergency Department (HOSPITAL_COMMUNITY): Payer: Medicare PPO

## 2023-06-05 DIAGNOSIS — S0083XA Contusion of other part of head, initial encounter: Secondary | ICD-10-CM | POA: Diagnosis not present

## 2023-06-05 MED ORDER — ONDANSETRON HCL 4 MG PO TABS
4.0000 mg | ORAL_TABLET | Freq: Four times a day (QID) | ORAL | 0 refills | Status: AC
Start: 2023-06-05 — End: 2023-06-07

## 2023-06-05 MED ORDER — IBUPROFEN 400 MG PO TABS
400.0000 mg | ORAL_TABLET | Freq: Once | ORAL | Status: AC | PRN
  Administered 2023-06-05: 400 mg via ORAL
  Filled 2023-06-05: qty 1

## 2023-06-05 MED ORDER — OXYCODONE-ACETAMINOPHEN 5-325 MG PO TABS: 1.0000 | ORAL_TABLET | Freq: Four times a day (QID) | ORAL | 0 refills | Status: DC | PRN

## 2023-06-05 MED ORDER — OXYCODONE-ACETAMINOPHEN 5-325 MG PO TABS
1.0000 | ORAL_TABLET | ORAL | Status: DC | PRN
Start: 2023-06-05 — End: 2023-06-05
  Administered 2023-06-05: 1 via ORAL
  Filled 2023-06-05: qty 1

## 2023-06-05 NOTE — ED Notes (Signed)
R shoulder and FA swelling and bruising, pain, TTP. CMS intact. Skin intact. ROM limited. Compartments soft. Pulses strong. Hematoma to R FA noted.

## 2023-06-05 NOTE — ED Notes (Signed)
ED PA at [facility] 

## 2023-06-05 NOTE — ED Provider Notes (Signed)
Megan [facility] EMERGENCY DEPARTMENT AT [facility] Provider Note   CSN: [id number] Arrival date & time: 06/04/23  2103     History  Chief Complaint  Patient presents with   Marletta Lor    Megan Stokes is a 72 y.o. female with PMHx HTN, hypothyroidism, HLD who presents to ED after fall. Patient was walking her dog around 7PM last night when the dog pulled on the leash and caused her to fall. Patient landed onto right elbow. Having pain in right elbow and right shoulder. Denies head trauma, LOC, seizures, blood thinners. Does endorse a mild headache currently which patient thinks may be d/t her HTN. Also stating that she has had some intermittent fevers recently but no other infectious symptoms.  Denies fever, chest pain, dyspnea, cough, nausea, vomiting, diarrhea.    Fall       Home Medications Prior to Admission medications   Medication Sig Start Date End Date Taking? Authorizing Provider  alendronate (FOSAMAX) 70 MG tablet Take 1 tablet (70 mg total) by mouth once a week. 05/14/23  Yes Dugal, Wyatt Mage, FNP  cholecalciferol (VITAMIN D3) 25 MCG (1000 UNIT) tablet Take 1,000 Units by mouth daily.   Yes [provider]  cyanocobalamin (VITAMIN B12) 1000 MCG tablet Take 1,000 mcg by mouth daily.   Yes [provider]  levothyroxine (SYNTHROID) 75 MCG tablet Take 1 tablet (75 mcg total) by mouth See admin instructions. 75 mcg every day EXCEPT Saturday 07/17/22  Yes Dugal, Wyatt Mage, FNP  lisinopril (ZESTRIL) 30 MG tablet Take 1 tablet by mouth once daily for blood pressure 08/04/22  Yes Dugal, Tabitha, FNP  lovastatin (MEVACOR) 40 MG tablet TAKE 1 TABLET BY MOUTH ONCE DAILY 01/19/23  Yes Dugal, Tabitha, FNP  ondansetron (ZOFRAN) 4 MG tablet Take 1 tablet (4 mg total) by mouth every 6 (six) hours for 5 doses. 06/05/23 06/07/23 Yes Valrie Hart F, PA-C  oxyCODONE-acetaminophen (PERCOCET/ROXICET) 5-325 MG tablet Take 1 tablet by mouth every 6 (six) hours as needed  for up to 5 doses for severe pain (pain score 7-10). 06/05/23  Yes Valrie Hart F, PA-C  hydrocortisone (ANUSOL-HC) 2.5 % rectal cream Place 1 Application rectally 2 (two) times daily. Patient not taking: Reported on 06/05/2023 04/25/23   Mort Sawyers, FNP  hydrocortisone-pramoxine (PROCTOFOAM HC) rectal foam Place 1 applicator rectally 2 (two) times daily. Patient not taking: Reported on 06/05/2023 04/25/23   Mort Sawyers, FNP      Allergies    Augmentin [amoxicillin-pot clavulanate] and Codeine    Review of Systems   Review of Systems  Musculoskeletal:        Elbow pain    Physical Exam Updated Vital Signs BP (!) 168/77 (BP Location: Left Arm)   Pulse (!) 53   Temp 97.9 F (36.6 C) (Oral)   Resp 18   Ht 5' (1.524 m)   Wt 47.6 kg   SpO2 99%   BMI 20.51 kg/m  Physical Exam Vitals and nursing note reviewed.  Constitutional:      General: She is not in acute distress.    Appearance: She is not ill-appearing or toxic-appearing.  HENT:     Head: Normocephalic and atraumatic.     Mouth/Throat:     Mouth: Mucous membranes are moist.  Eyes:     General: No scleral icterus.       Right eye: No discharge.        Left eye: No discharge.     Conjunctiva/sclera: Conjunctivae normal.  Cardiovascular:  Rate and Rhythm: Normal rate and regular rhythm.     Pulses: Normal pulses.     Heart sounds: Normal heart sounds. No murmur heard. Pulmonary:     Effort: Pulmonary effort is normal. No respiratory distress.     Breath sounds: Normal breath sounds. No wheezing, rhonchi or rales.  Abdominal:     General: Abdomen is flat. Bowel sounds are normal.     Palpations: Abdomen is soft.     Tenderness: There is no abdominal tenderness.  Musculoskeletal:     Right lower leg: No edema.     Left lower leg: No edema.     Comments: +2 radial pulse. Sensation to light touch intact. Area non-tense. Mild swelling of proximal radial head looks like superficial hematoma. Right elbow  and shoulder ROM mildly-moderately restricted d/t pain.  Skin:    General: Skin is warm and dry.     Findings: No rash.  Neurological:     General: No focal deficit present.     Mental Status: She is alert. Mental status is at baseline.     Comments: GCS 15. Speech is goal oriented. No deficits appreciated to CN III-XII; symmetric eyebrow raise, no facial drooping, tongue midline. Patient has equal grip strength bilaterally with 5/5 strength against resistance in all major muscle groups bilaterally. Sensation to light touch intact. Patient moves extremities without ataxia. Patient ambulatory with steady gait.   Psychiatric:        Mood and Affect: Mood normal.     ED Results / Procedures / Treatments   Labs (all labs ordered are listed, but only abnormal results are displayed) Labs Reviewed - No data to display  EKG None  Radiology CT Head Wo Contrast Result Date: 06/05/2023 CLINICAL DATA:  Dog pulled patient down, hematoma on right forehead EXAM: CT HEAD WITHOUT CONTRAST CT CERVICAL SPINE WITHOUT CONTRAST TECHNIQUE: Multidetector CT imaging of the head and cervical spine was performed following the standard protocol without intravenous contrast. Multiplanar CT image reconstructions of the cervical spine were also generated. RADIATION DOSE REDUCTION: This exam was performed according to the departmental dose-optimization program which includes automated exposure control, adjustment of the mA and/or kV according to patient size and/or use of iterative reconstruction technique. COMPARISON:  None Available. FINDINGS: CT HEAD FINDINGS Brain: No evidence of acute infarct, hemorrhage, mass, mass effect, or midline shift. No hydrocephalus or extra-axial fluid collection. Vascular: No hyperdense vessel. Skull: Negative for fracture or focal lesion. Sinuses/Orbits: No acute finding. Other: The mastoid air cells are well aerated. CT CERVICAL SPINE FINDINGS Alignment: No traumatic listhesis.  Dextrocurvature, which may be positional. Trace anterolisthesis of C3 on C4 appears facet mediated. Skull base and vertebrae: No acute fracture or suspicious osseous lesion. Soft tissues and spinal canal: No prevertebral fluid or swelling. No visible canal hematoma. Disc levels: Degenerative changes in the cervical spine.No high-grade spinal canal stenosis. Upper chest: No focal pulmonary opacity or pleural effusion. IMPRESSION: 1. No acute intracranial process. 2. No acute fracture or traumatic listhesis in the cervical spine. Electronically Signed   By: Wiliam Ke M.D.   On: 06/05/2023 12:03   CT Cervical Spine Wo Contrast Result Date: 06/05/2023 CLINICAL DATA:  Dog pulled patient down, hematoma on right forehead EXAM: CT HEAD WITHOUT CONTRAST CT CERVICAL SPINE WITHOUT CONTRAST TECHNIQUE: Multidetector CT imaging of the head and cervical spine was performed following the standard protocol without intravenous contrast. Multiplanar CT image reconstructions of the cervical spine were also generated. RADIATION DOSE REDUCTION: This exam  was performed according to the departmental dose-optimization program which includes automated exposure control, adjustment of the mA and/or kV according to patient size and/or use of iterative reconstruction technique. COMPARISON:  None Available. FINDINGS: CT HEAD FINDINGS Brain: No evidence of acute infarct, hemorrhage, mass, mass effect, or midline shift. No hydrocephalus or extra-axial fluid collection. Vascular: No hyperdense vessel. Skull: Negative for fracture or focal lesion. Sinuses/Orbits: No acute finding. Other: The mastoid air cells are well aerated. CT CERVICAL SPINE FINDINGS Alignment: No traumatic listhesis. Dextrocurvature, which may be positional. Trace anterolisthesis of C3 on C4 appears facet mediated. Skull base and vertebrae: No acute fracture or suspicious osseous lesion. Soft tissues and spinal canal: No prevertebral fluid or swelling. No visible canal  hematoma. Disc levels: Degenerative changes in the cervical spine.No high-grade spinal canal stenosis. Upper chest: No focal pulmonary opacity or pleural effusion. IMPRESSION: 1. No acute intracranial process. 2. No acute fracture or traumatic listhesis in the cervical spine. Electronically Signed   By: Wiliam Ke M.D.   On: 06/05/2023 12:03   DG Forearm Right Result Date: 06/04/2023 CLINICAL DATA:  Pain after fall. EXAM: RIGHT FOREARM - 2 VIEW COMPARISON:  None Available. FINDINGS: No evidence of forearm fracture. The cortical margins of the radius and ulna are intact. Elbow alignment is maintained. Chronic wrist arthropathy. Focal soft tissue prominence about the dorsal aspect of the proximal forearm may represent hematoma in the setting of injury. IMPRESSION: Focal soft tissue prominence about the dorsal aspect of the proximal forearm may represent hematoma in the setting of injury. No acute fracture. Electronically Signed   By: Narda Rutherford M.D.   On: 06/04/2023 23:37   DG Shoulder Right Result Date: 06/04/2023 CLINICAL DATA:  Pain after fall. EXAM: RIGHT SHOULDER - 2+ VIEW COMPARISON:  None Available. FINDINGS: There is irregularity of the inferior scapular body that may represent a nondisplaced fracture. No additional fracture of the shoulder. Shoulder dislocation. There is minor acromioclavicular degenerative change. No erosions. Soft tissue calcifications superior to the humeral head. IMPRESSION: 1. Irregularity of the inferior scapular body may represent a nondisplaced fracture. Recommend correlation with point tenderness. 2. Soft tissue calcifications superior to the humeral head may represent calcific tendinitis. Electronically Signed   By: Narda Rutherford M.D.   On: 06/04/2023 23:37    Procedures Procedures    Medications Ordered in ED Medications  oxyCODONE-acetaminophen (PERCOCET/ROXICET) 5-325 MG per tablet 1 tablet (1 tablet Oral Given 06/05/23 0927)  acetaminophen (TYLENOL)  tablet 650 mg (650 mg Oral Given 06/04/23 2304)  ibuprofen (ADVIL) tablet 400 mg (400 mg Oral Given 06/05/23 1308)    ED Course/ Medical Decision Making/ A&P                                 Medical Decision Making Amount and/or Complexity of Data Reviewed Radiology: ordered.  Risk OTC drugs. Prescription drug management.   This patient presents to the ED after a fall, this involves an extensive number of treatment options, and is a complaint that carries with it a high risk of complications and morbidity.  The differential diagnosis includes  intracranial hemorrhage, subdural/epidural hematoma, vertebral fracture, spinal cord injury, muscle strain, skull fracture, fracture.   Co morbidities that complicate the patient evaluation  HTN, hypothyroidism   Additional history obtained:  Dr. Alfonse Alpers PCP   Imaging Studies ordered:  I ordered imaging studies including  - forearm/shoulder xray: to assess for complications given patient's  trauma -CT head/cervical spine: to assess for process contributing to patient's symptoms I independently visualized and interpreted imaging I agree with the radiologist interpretation   Problem List / ED Course / Critical interventions / Medication management  Patient presented for Fall. Patient with stable vitals and does not appear to be in distress.  Physical exam with tenderness of right elbow and shoulder.  +2 radial pulse.  Sensation light touch intact.  Area nontense.  Range of motion is mild to moderately restricted in the right elbow and right shoulder.  Rest of physical/neuroexam reassuring. X-ray showing irregularity of the right scapula without displacement.  May be concerning for non-displaced scapular fracture.  Patient does have some very mild tenderness to palpation of her right scapula.  This right arm is neurovascularly intact.  Shoulder xray does visualized the right side of chest very well and is not showing and broken ribs/clavicle  or any other complications. Provided patient with arm sling and will have her follow-up with outpatient orthopedics. CT head imaging obtained given fall and current HA. This imaging was reassuring.  Patient will be encouraged to follow-up with primary care provider to be reevaluated in the next few days. Patient verbalized understanding of plan.  Headache has resolved. Patient states that she feels a lot better and is ready to go home. Will give patient a couple doses of Percocet for breakthrough pain. Patient had Percocet here in ED and tolerated well. Patient educated on alternating Ibuprofen and Tylenol for pain management. Staffed with Dr. Rhunette Croft I have reviewed the patients home medicines and have made adjustments as needed Patient was given return precautions. Patient stable for discharge at this time. Patient verbalized understanding of plan.   DDx: These are considered less likely due to history of present illness and physical exam findings -Intracranial hemorrhage, subdural/epidural hematoma: CT reassuring; no neurodeficits -Vertebral fracture: no step-off/crepitus/abnormalities palpated -Spinal cord injury: CT reassuring; no neurodeficits -Skull fracture: CT reassuring   Social Determinants of Health:  none             Final Clinical Impression(s) / ED Diagnoses Final diagnoses:  Fall, initial encounter  Closed nondisplaced fracture of body of right scapula, initial encounter    Rx / DC Orders ED Discharge Orders          Ordered    oxyCODONE-acetaminophen (PERCOCET/ROXICET) 5-325 MG tablet  Every 6 hours PRN        06/05/23 1215    ondansetron (ZOFRAN) 4 MG tablet  Every 6 hours        06/05/23 1215              Dorthy Cooler, [place] 06/05/23 1336    Derwood Kaplan, MD 06/05/23 1346

## 2023-06-05 NOTE — Discharge Instructions (Addendum)
It was a pleasure caring for you today. Please follow up with Dr. Carola Frost with Orthopedics. Also follow up with your primary care provider. Seek emergency care if experiencing any new or worsening problems.  Alternating between 650 mg Tylenol and 400 mg Advil: The best way to alternate taking Acetaminophen (example Tylenol) and Ibuprofen (example Advil/Motrin) is to take them 3 hours apart. For example, if you take ibuprofen at 6 am you can then take Tylenol at 9 am. You can continue this regimen throughout the day, making sure you do not exceed the recommended maximum dose for each drug.

## 2023-06-11 DIAGNOSIS — S42101A Fracture of unspecified part of scapula, right shoulder, initial encounter for closed fracture: Secondary | ICD-10-CM | POA: Diagnosis not present

## 2023-06-11 DIAGNOSIS — S5011XA Contusion of right forearm, initial encounter: Secondary | ICD-10-CM | POA: Diagnosis not present

## 2023-06-21 ENCOUNTER — Telehealth: Payer: Self-pay

## 2023-06-21 NOTE — Progress Notes (Signed)
 Transition Care Management Unsuccessful Follow-up Telephone Call  Date of discharge and from where:  Megan [facility] 12/17  Attempts:  1st Attempt  Reason for unsuccessful TCM follow-up call:  No answer/busy   Megan Stokes  [facility], [facility] Guide, Phone: [phone] Website: [email or web address]

## 2023-06-22 ENCOUNTER — Telehealth: Payer: Self-pay

## 2023-06-22 NOTE — Progress Notes (Signed)
 Transition Care Management Unsuccessful Follow-up Telephone Call  Date of discharge and from where:  Megan [facility] 12/17  Attempts:  2nd Attempt  Reason for unsuccessful TCM follow-up call:  No answer/busy   Jon Colt [place]  [facility], [facility] Guide, Phone: [phone] Website: [email or web address]

## 2023-07-18 DIAGNOSIS — S5011XD Contusion of right forearm, subsequent encounter: Secondary | ICD-10-CM | POA: Diagnosis not present

## 2023-07-18 DIAGNOSIS — S42101D Fracture of unspecified part of scapula, right shoulder, subsequent encounter for fracture with routine healing: Secondary | ICD-10-CM | POA: Diagnosis not present

## 2023-07-23 DIAGNOSIS — Z1231 Encounter for screening mammogram for malignant neoplasm of breast: Secondary | ICD-10-CM | POA: Diagnosis not present

## 2023-07-23 LAB — HM MAMMOGRAPHY

## 2023-07-24 ENCOUNTER — Encounter: Payer: Self-pay | Admitting: Family

## 2023-08-06 ENCOUNTER — Other Ambulatory Visit: Payer: Self-pay | Admitting: Family

## 2023-08-06 DIAGNOSIS — I10 Essential (primary) hypertension: Secondary | ICD-10-CM

## 2023-08-29 DIAGNOSIS — H6123 Impacted cerumen, bilateral: Secondary | ICD-10-CM | POA: Diagnosis not present

## 2023-09-19 ENCOUNTER — Encounter: Payer: Self-pay | Admitting: Internal Medicine

## 2023-10-11 ENCOUNTER — Other Ambulatory Visit: Payer: Self-pay | Admitting: Family

## 2023-10-11 DIAGNOSIS — E039 Hypothyroidism, unspecified: Secondary | ICD-10-CM

## 2023-10-26 ENCOUNTER — Encounter: Payer: Self-pay | Admitting: Family

## 2023-10-26 ENCOUNTER — Ambulatory Visit: Payer: Self-pay | Admitting: *Deleted

## 2023-10-26 ENCOUNTER — Other Ambulatory Visit: Payer: Self-pay | Admitting: Family

## 2023-10-26 DIAGNOSIS — B009 Herpesviral infection, unspecified: Secondary | ICD-10-CM

## 2023-10-26 MED ORDER — VALACYCLOVIR HCL 1 G PO TABS: ORAL_TABLET | ORAL | 0 refills | Status: AC

## 2023-10-26 NOTE — Telephone Encounter (Signed)
 Copied from CRM [id number]. Topic: Clinical - Medication Refill >> Oct 26, 2023  8:12 AM Dorisann Garre T wrote: Medication: medication for a fever blister   Has the patient contacted their pharmacy? No (Agent: If no, request that the patient contact the pharmacy for the refill. If patient does not wish to contact the pharmacy document the reason why and proceed with request.) (Agent: If yes, when and what did the pharmacy advise?)  This is the patient's preferred pharmacy:  [facility] [address] ([address] [place] - [address] [place] ([place]) [place] [address] Phone: [phone] Fax: [phone]  Is this the correct pharmacy for this prescription? Yes If no, delete pharmacy and type the correct one.   Has the prescription been filled recently? No  Is the patient out of the medication? Yes  Has the patient been seen for an appointment in the last year OR does the patient have an upcoming appointment? Yes  Can we respond through MyChart? No  Agent: Please be advised that Rx refills may take up to 3 business days. We ask that you follow-up with your pharmacy. 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  Answer Assessment - Initial Assessment Questions 1. APPEARANCE of BLISTERS: "Describe the sores."     I have a fever blister.   Tabitha had called in an rx for me for this.   I need a refill for it.  2. SIZE: "How large an area is involved with the cold sores?" (e.g., inches, cm or compare to coins)     It's a fever blister that has doubled in size.   It's one on top of another one.   It's draining and painful 3. LOCATION: "Which part of the lip is involved?"     Upper lip 4. ONSET: "When did the fever blisters begin?"     Started a week ago 5. RECURRENT BLISTERS: "Have you  had fever blisters before?" If Yes, ask: "When was the last time?" "How many times a year?"     Yes   6. OTHER SYMPTOMS: "Do you have any other symptoms?" (e.g., fever, sores inside mouth)     No other symptoms    7. PREGNANCY: "Is there any chance you are pregnant?" "When was your last menstrual period?"     N/A  Protocols used: Cold Sores (Fever Blisters)-A-AH  Chief Complaint: She has a fever blister on her upper lip that started a week ago.  Pt requesting a refill of the medication Tabitha Dugal, FNP gives her when she has these fever blisters.   She forgot the name of it.   (I looked back in chart and it was Valtrex 1,000 mg tablets.   Last rx was 12/26/2022).  Symptoms: It's painful and draining.  It looks like one on top of another.  I'm really needing that refill. Frequency: For the last week.  It's getting larger and more sore Pertinent Negatives: Patient denies other symptoms. Disposition: [] ED /[] Urgent Care (no appt availability in office) / [] Appointment(In office/virtual)/ []  [facility]/ [] Home Care/ [] Refused Recommended Disposition /[]  Mobile Bus/ [x]  Follow-up with PCP Additional Notes: Requesting a refill of the Valtrex 1,000 mg tablets for fever blister.   Please send to [id number] on  [address]     Request sent to Felicita Horns, FNP.

## 2023-10-29 ENCOUNTER — Other Ambulatory Visit: Payer: Self-pay | Admitting: Family

## 2023-10-29 DIAGNOSIS — M25511 Pain in right shoulder: Secondary | ICD-10-CM | POA: Diagnosis not present

## 2023-11-19 ENCOUNTER — Ambulatory Visit: Attending: Orthopedic Surgery

## 2023-11-19 DIAGNOSIS — R29898 Other symptoms and signs involving the musculoskeletal system: Secondary | ICD-10-CM | POA: Insufficient documentation

## 2023-11-19 DIAGNOSIS — M6281 Muscle weakness (generalized): Secondary | ICD-10-CM | POA: Insufficient documentation

## 2023-11-19 DIAGNOSIS — M25611 Stiffness of right shoulder, not elsewhere classified: Secondary | ICD-10-CM | POA: Insufficient documentation

## 2023-11-20 ENCOUNTER — Ambulatory Visit: Admitting: Physical Therapy

## 2023-11-20 ENCOUNTER — Other Ambulatory Visit: Payer: Self-pay

## 2023-11-20 ENCOUNTER — Encounter: Payer: Self-pay | Admitting: Physical Therapy

## 2023-11-20 DIAGNOSIS — M6281 Muscle weakness (generalized): Secondary | ICD-10-CM | POA: Diagnosis not present

## 2023-11-20 DIAGNOSIS — M25611 Stiffness of right shoulder, not elsewhere classified: Secondary | ICD-10-CM

## 2023-11-20 DIAGNOSIS — R29898 Other symptoms and signs involving the musculoskeletal system: Secondary | ICD-10-CM
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
## 2023-11-23 ENCOUNTER — Ambulatory Visit: Admitting: Physical Therapy

## 2023-11-23 ENCOUNTER — Encounter: Payer: Self-pay | Admitting: Physical Therapy

## 2023-11-23 DIAGNOSIS — M25611 Stiffness of right shoulder, not elsewhere classified: Secondary | ICD-10-CM | POA: Diagnosis not present

## 2023-11-23 DIAGNOSIS — R29898 Other symptoms and signs involving the musculoskeletal system: Secondary | ICD-10-CM | POA: Diagnosis not present

## 2023-11-23 DIAGNOSIS — M6281 Muscle weakness (generalized): Secondary | ICD-10-CM
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
## 2023-11-27 ENCOUNTER — Encounter: Payer: Self-pay | Admitting: Physical Therapy

## 2023-11-27 ENCOUNTER — Ambulatory Visit: Admitting: Physical Therapy

## 2023-11-27 DIAGNOSIS — M6281 Muscle weakness (generalized): Secondary | ICD-10-CM | POA: Diagnosis not present

## 2023-11-27 DIAGNOSIS — M25611 Stiffness of right shoulder, not elsewhere classified: Secondary | ICD-10-CM | POA: Diagnosis not present

## 2023-11-27 DIAGNOSIS — R29898 Other symptoms and signs involving the musculoskeletal system: Secondary | ICD-10-CM | POA: Diagnosis not present
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
## 2023-11-29 ENCOUNTER — Ambulatory Visit: Admitting: Physical Therapy

## 2023-11-29 ENCOUNTER — Encounter: Payer: Self-pay | Admitting: Physical Therapy

## 2023-11-29 DIAGNOSIS — M25611 Stiffness of right shoulder, not elsewhere classified: Secondary | ICD-10-CM

## 2023-11-29 DIAGNOSIS — R29898 Other symptoms and signs involving the musculoskeletal system: Secondary | ICD-10-CM | POA: Diagnosis not present

## 2023-11-29 DIAGNOSIS — M6281 Muscle weakness (generalized): Secondary | ICD-10-CM
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
## 2023-12-05 ENCOUNTER — Encounter: Payer: Self-pay | Admitting: Physical Therapy

## 2023-12-05 ENCOUNTER — Ambulatory Visit: Admitting: Physical Therapy

## 2023-12-05 DIAGNOSIS — R29898 Other symptoms and signs involving the musculoskeletal system: Secondary | ICD-10-CM | POA: Diagnosis not present

## 2023-12-05 DIAGNOSIS — M25611 Stiffness of right shoulder, not elsewhere classified: Secondary | ICD-10-CM

## 2023-12-05 DIAGNOSIS — M6281 Muscle weakness (generalized): Secondary | ICD-10-CM
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
## 2023-12-07 ENCOUNTER — Encounter: Payer: Self-pay | Admitting: Physical Therapy

## 2023-12-07 ENCOUNTER — Ambulatory Visit: Admitting: Physical Therapy

## 2023-12-07 DIAGNOSIS — M25611 Stiffness of right shoulder, not elsewhere classified: Secondary | ICD-10-CM | POA: Diagnosis not present

## 2023-12-07 DIAGNOSIS — M6281 Muscle weakness (generalized): Secondary | ICD-10-CM | POA: Diagnosis not present

## 2023-12-07 DIAGNOSIS — R29898 Other symptoms and signs involving the musculoskeletal system: Secondary | ICD-10-CM
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
## 2023-12-11 ENCOUNTER — Ambulatory Visit: Admitting: Physical Therapy

## 2023-12-11 ENCOUNTER — Encounter: Payer: Self-pay | Admitting: Physical Therapy

## 2023-12-11 DIAGNOSIS — M25611 Stiffness of right shoulder, not elsewhere classified: Secondary | ICD-10-CM | POA: Diagnosis not present

## 2023-12-11 DIAGNOSIS — M6281 Muscle weakness (generalized): Secondary | ICD-10-CM

## 2023-12-11 DIAGNOSIS — R29898 Other symptoms and signs involving the musculoskeletal system: Secondary | ICD-10-CM | POA: Diagnosis not present
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
## 2023-12-12 DIAGNOSIS — S42101D Fracture of unspecified part of scapula, right shoulder, subsequent encounter for fracture with routine healing: Secondary | ICD-10-CM | POA: Diagnosis not present

## 2023-12-12 DIAGNOSIS — M25511 Pain in right shoulder: Secondary | ICD-10-CM | POA: Diagnosis not present

## 2023-12-13 ENCOUNTER — Ambulatory Visit: Admitting: Physical Therapy

## 2024-01-15 ENCOUNTER — Other Ambulatory Visit: Payer: Self-pay | Admitting: Family

## 2024-01-15 DIAGNOSIS — E039 Hypothyroidism, unspecified: Secondary | ICD-10-CM

## 2024-01-26 ENCOUNTER — Other Ambulatory Visit: Payer: Self-pay | Admitting: Family

## 2024-01-28 NOTE — Telephone Encounter (Signed)
 Spoke to pt, sch f/u for 02/12/24

## 2024-02-12 ENCOUNTER — Ambulatory Visit: Admitting: Family

## 2024-02-12 ENCOUNTER — Encounter: Payer: Self-pay | Admitting: Family

## 2024-02-12 VITALS — BP 114/80 | HR 60 | Temp 97.8°F | Ht 60.0 in | Wt 101.2 lb

## 2024-02-12 DIAGNOSIS — E871 Hypo-osmolality and hyponatremia: Secondary | ICD-10-CM | POA: Diagnosis not present

## 2024-02-12 DIAGNOSIS — M810 Age-related osteoporosis without current pathological fracture: Secondary | ICD-10-CM

## 2024-02-12 DIAGNOSIS — Z780 Asymptomatic menopausal state: Secondary | ICD-10-CM | POA: Diagnosis not present

## 2024-02-12 DIAGNOSIS — D72810 Lymphocytopenia: Secondary | ICD-10-CM

## 2024-02-12 DIAGNOSIS — D649 Anemia, unspecified: Secondary | ICD-10-CM | POA: Diagnosis not present

## 2024-02-12 DIAGNOSIS — S42101D Fracture of unspecified part of scapula, right shoulder, subsequent encounter for fracture with routine healing: Secondary | ICD-10-CM | POA: Diagnosis not present

## 2024-02-12 LAB — BASIC METABOLIC PANEL WITH GFR
BUN: 7 mg/dL (ref 6–23)
CO2: 28 meq/L (ref 19–32)
Calcium: 9.2 mg/dL (ref 8.4–10.5)
Chloride: 96 meq/L (ref 96–112)
Creatinine, Ser: 0.52 mg/dL (ref 0.40–1.20)
GFR: 91.06 mL/min (ref >60.00)
Glucose, Bld: 90 mg/dL (ref 70–99)
Potassium: 4.9 meq/L (ref 3.5–5.1)
Sodium: 134 meq/L — ABNORMAL LOW (ref 135–145)

## 2024-02-12 LAB — IBC + FERRITIN
Ferritin: 62.4 ng/mL (ref 10.0–291.0)
Iron: 140 ug/dL (ref 42–145)
Saturation Ratios: 41.5 % (ref 20.0–50.0)
TIBC: 337.4 ug/dL (ref 250.0–450.0)
Transferrin: 241 mg/dL (ref 212.0–360.0)

## 2024-02-12 LAB — CBC
HCT: 34.1 % — ABNORMAL LOW (ref 36.0–46.0)
Hemoglobin: 11.7 g/dL — ABNORMAL LOW (ref 12.0–15.0)
MCHC: 34.3 g/dL (ref 30.0–36.0)
MCV: 101.7 fl — ABNORMAL HIGH (ref 78.0–100.0)
Platelets: 290 K/uL (ref 150.0–400.0)
RBC: 3.35 Mil/uL — ABNORMAL LOW (ref 3.87–5.11)
RDW: 12.4 % (ref 11.5–15.5)
WBC: 3.6 K/uL — ABNORMAL LOW (ref 4.0–10.5)

## 2024-02-12 NOTE — Progress Notes (Signed)
 Established Patient Office Visit  Subjective:      CC:  Chief Complaint  Patient presents with   Medical Management of Chronic Issues    HPI: Megan Stokes is a 73 y.o. female presenting on 02/12/2024 for Medical Management of Chronic Issues .  Discussed the use of AI scribe software for clinical note transcription with the patient, who gave verbal consent to proceed.  History of Present Illness Megan Stokes is a 73 year old female with osteoporosis who presents for a routine follow-up and evaluation of right shoulder pain following a fall.  She fell on December 16th while walking her dog, resulting in a backward fall and landing on her back, leading to a fracture of her right shoulder blade. She experiences persistent pain in the right shoulder, radiating down the arm. Physical therapy was completed but sometimes worsened her pain. An x-ray showed irregularity of the inferior scapular body and soft tissue calcifications. No MRI has been performed.  She is currently taking Fosamax  for osteoporosis and has had a bone density test in January 2020. She takes calcium and vitamin D  supplements regularly. She occasionally uses ibuprofen  for pain relief and has used Voltaren gel in the past. She has a few muscle relaxers left from a previous prescription, which she uses occasionally for muscle tightness.  She has some difficulty with certain movements of her right arm, such as scratching her back, which causes discomfort. Muscle tightness is experienced at the base of her neck. She has a history of scoliosis and notes that her shoulder has a 'ball' that keeps it very straight.  She has not had an eye exam since her twenties, despite her sister-in-law's suggestion to visit an eye doctor. There is a family history of her parents frequently visiting eye doctors but opting for drugstore glasses instead.         Social history:  Relevant past medical, surgical, family and social  history reviewed and updated as indicated. Interim medical history since our last visit reviewed.  Allergies and medications reviewed and updated.  DATA REVIEWED: CHART IN EPIC     ROS: Negative unless specifically indicated above in HPI.    Current Outpatient Medications:    alendronate  (FOSAMAX ) 70 MG tablet, Take 1 tablet by mouth once a week, Disp: 12 tablet, Rfl: 0   cholecalciferol (VITAMIN D3) 25 MCG (1000 UNIT) tablet, Take 1,000 Units by mouth daily., Disp: , Rfl:    cyanocobalamin (VITAMIN B12) 1000 MCG tablet, Take 1,000 mcg by mouth daily., Disp: , Rfl:    levothyroxine  (SYNTHROID ) 75 MCG tablet, TAKE 1 TABLET BY MOUTH ONCE DAILY EXCEPT  SATURDAY, Disp: 90 tablet, Rfl: 0   lisinopril  (ZESTRIL ) 30 MG tablet, Take 1 tablet by mouth once daily for blood pressure, Disp: 90 tablet, Rfl: 1   lovastatin  (MEVACOR ) 40 MG tablet, TAKE 1 TABLET EVERY DAY, Disp: 90 tablet, Rfl: 3   oxyCODONE -acetaminophen  (PERCOCET/ROXICET) 5-325 MG tablet, Take 1 tablet by mouth every 6 (six) hours as needed for up to 5 doses for severe pain (pain score 7-10)., Disp: 5 tablet, Rfl: 0   valACYclovir  (VALTREX ) 1000 MG tablet, Take one tablet bid for one dose prn breakout, Disp: 30 tablet, Rfl: 0        Objective:        BP 114/80 (BP Location: Left Arm, Patient Position: Sitting, Cuff Size: Normal)   Pulse 60   Temp 97.8 F (36.6 C) (Temporal)   Ht 5' (1.524 m)  Wt 101 lb 3.2 oz (45.9 kg)   SpO2 98%   BMI 19.76 kg/m   Physical Exam MUSCULOSKELETAL: Tenderness in right shoulder, muscle spasm in right shoulder, muscle tightness at base of neck, spinal curvature observed.  Wt Readings from Last 3 Encounters:  02/12/24 101 lb 3.2 oz (45.9 kg)  06/04/23 105 lb (47.6 kg)  05/28/23 105 lb (47.6 kg)    Physical Exam Vitals reviewed.  Musculoskeletal:     Right shoulder: Deformity and bony tenderness (ac joint, and posterior scapular) present. No swelling or effusion. Decreased range of  motion (positive apley positive drop can pain upon external rotation). Normal strength.          Results RADIOLOGY Shoulder X-ray: Irregularity of the inferior scapular body, possible nondisplaced fracture, soft tissue calcifications may represent calcific tendinitis (05/2023)  DIAGNOSTIC Bone Density: Osteoporosis (06/2023)  Assessment & Plan:   Assessment and Plan Assessment & Plan Right scapular fracture with persistent pain and muscle spasm Persistent pain and muscle spasm in the right shoulder following a fall in December 2025, resulting in a possible nondisplaced fracture of the inferior scapular body. Soft tissue calcifications may indicate calcific tendinitis, contributing to nerve-related pain. Physical therapy has been completed but has exacerbated the pain. No MRI has been performed yet. - Contact Dr. Celena to discuss the possibility of an MRI due to persistent pain and worsening symptoms post-physical therapy. - Recommend ibuprofen  or acetaminophen  as needed for pain management. - Suggest Voltaren gel (diclofenac) for topical pain relief if accessible. - Encourage heat therapy for muscle spasm relief. - Consider muscle relaxants at night if pain persists and interferes with sleep.  Age-related osteoporosis Osteoporosis management with Fosamax  is ongoing. A repeat bone density scan is warranted to assess for progression of osteoporosis, especially in light of the recent fracture and persistent pain. - Order bone density scan to evaluate for progression of osteoporosis. - Discuss potential change in osteoporosis treatment if bone density results indicate worsening, including options like Prolia.  Anemia Anemia monitoring is part of routine management. - Order lab tests to monitor anemia status.  Hyponatremia Hyponatremia monitoring is part of routine management. - Order lab tests to monitor sodium levels.  Lymphocytopenia Lymphocytopenia monitoring is part of routine  management. - Order lab tests to monitor lymphocyte levels.  Recording duration: 14 minutes      Return in about 6 months (around 08/14/2024) for f/u CPE.     Ginger Patrick, MSN, APRN, FNP-C [facility]

## 2024-02-13 ENCOUNTER — Ambulatory Visit: Payer: Self-pay | Admitting: Family

## 2024-02-24 ENCOUNTER — Other Ambulatory Visit: Payer: Self-pay | Admitting: Family

## 2024-02-24 DIAGNOSIS — I10 Essential (primary) hypertension: Secondary | ICD-10-CM

## 2024-02-27 DIAGNOSIS — M75101 Unspecified rotator cuff tear or rupture of right shoulder, not specified as traumatic: Secondary | ICD-10-CM | POA: Diagnosis not present

## 2024-04-13 ENCOUNTER — Other Ambulatory Visit: Payer: Self-pay | Admitting: Family

## 2024-04-13 DIAGNOSIS — K644 Residual hemorrhoidal skin tags: Secondary | ICD-10-CM

## 2024-04-30 ENCOUNTER — Other Ambulatory Visit: Payer: Self-pay | Admitting: Family

## 2024-04-30 DIAGNOSIS — E039 Hypothyroidism, unspecified: Secondary | ICD-10-CM

## 2024-05-25 ENCOUNTER — Other Ambulatory Visit: Payer: Self-pay | Admitting: Family

## 2024-05-25 DIAGNOSIS — I10 Essential (primary) hypertension: Secondary | ICD-10-CM

## 2024-05-30 ENCOUNTER — Other Ambulatory Visit: Payer: Self-pay | Admitting: Orthopedic Surgery

## 2024-05-30 DIAGNOSIS — M75101 Unspecified rotator cuff tear or rupture of right shoulder, not specified as traumatic: Secondary | ICD-10-CM

## 2024-06-11 ENCOUNTER — Encounter: Admitting: Family

## 2024-06-13 ENCOUNTER — Ambulatory Visit: Admitting: Family

## 2024-06-13 ENCOUNTER — Encounter: Payer: Self-pay | Admitting: Family

## 2024-06-13 ENCOUNTER — Ambulatory Visit
Admission: RE | Admit: 2024-06-13 | Discharge: 2024-06-13 | Disposition: A | Discharge status: Home / Self Care | Source: Ambulatory Visit | Attending: Family | Admitting: Family

## 2024-06-13 VITALS — BP 138/88 | HR 58 | Temp 98.0°F | Ht 61.0 in | Wt 102.0 lb

## 2024-06-13 DIAGNOSIS — R1031 Right lower quadrant pain: Secondary | ICD-10-CM

## 2024-06-13 DIAGNOSIS — Z23 Encounter for immunization: Secondary | ICD-10-CM | POA: Diagnosis not present

## 2024-06-13 DIAGNOSIS — I10 Essential (primary) hypertension: Secondary | ICD-10-CM | POA: Diagnosis not present

## 2024-06-13 DIAGNOSIS — E559 Vitamin D deficiency, unspecified: Secondary | ICD-10-CM

## 2024-06-13 DIAGNOSIS — E7800 Pure hypercholesterolemia, unspecified: Secondary | ICD-10-CM

## 2024-06-13 DIAGNOSIS — Z1231 Encounter for screening mammogram for malignant neoplasm of breast: Secondary | ICD-10-CM | POA: Diagnosis not present

## 2024-06-13 DIAGNOSIS — Z1211 Encounter for screening for malignant neoplasm of colon: Secondary | ICD-10-CM | POA: Diagnosis not present

## 2024-06-13 DIAGNOSIS — Z0001 Encounter for general adult medical examination with abnormal findings: Secondary | ICD-10-CM

## 2024-06-13 DIAGNOSIS — R300 Dysuria: Secondary | ICD-10-CM | POA: Diagnosis not present

## 2024-06-13 DIAGNOSIS — M810 Age-related osteoporosis without current pathological fracture: Secondary | ICD-10-CM | POA: Diagnosis not present

## 2024-06-13 DIAGNOSIS — E039 Hypothyroidism, unspecified: Secondary | ICD-10-CM

## 2024-06-13 LAB — CBC WITH DIFFERENTIAL/PLATELET
Basophils Absolute: 0 K/uL (ref 0.0–0.1)
Basophils Relative: 0.7 % (ref 0.0–3.0)
Eosinophils Absolute: 0.1 K/uL (ref 0.0–0.7)
Eosinophils Relative: 1.3 % (ref 0.0–5.0)
HCT: 30.8 % — ABNORMAL LOW (ref 36.0–46.0)
Hemoglobin: 10.7 g/dL — ABNORMAL LOW (ref 12.0–15.0)
Lymphocytes Relative: 26.8 % (ref 12.0–46.0)
Lymphs Abs: 1.2 K/uL (ref 0.7–4.0)
MCHC: 34.8 g/dL (ref 30.0–36.0)
MCV: 100.2 fl — ABNORMAL HIGH (ref 78.0–100.0)
Monocytes Absolute: 0.5 K/uL (ref 0.1–1.0)
Monocytes Relative: 11.6 % (ref 3.0–12.0)
Neutro Abs: 2.7 K/uL (ref 1.4–7.7)
Neutrophils Relative %: 59.6 % (ref 43.0–77.0)
Platelets: 306 K/uL (ref 150.0–400.0)
RBC: 3.07 Mil/uL — ABNORMAL LOW (ref 3.87–5.11)
RDW: 12.2 % (ref 11.5–15.5)
WBC: 4.5 K/uL (ref 4.0–10.5)

## 2024-06-13 LAB — COMPREHENSIVE METABOLIC PANEL WITH GFR
ALT: 12 U/L (ref 3–35)
AST: 20 U/L (ref 5–37)
Albumin: 4.5 g/dL (ref 3.5–5.2)
Alkaline Phosphatase: 48 U/L (ref 39–117)
BUN: 12 mg/dL (ref 6–23)
CO2: 28 meq/L (ref 19–32)
Calcium: 9 mg/dL (ref 8.4–10.5)
Chloride: 97 meq/L (ref 96–112)
Creatinine, Ser: 0.54 mg/dL (ref 0.40–1.20)
GFR: 90.03 mL/min
Glucose, Bld: 83 mg/dL (ref 70–99)
Potassium: 3.9 meq/L (ref 3.5–5.1)
Sodium: 132 meq/L — ABNORMAL LOW (ref 135–145)
Total Bilirubin: 0.7 mg/dL (ref 0.2–1.2)
Total Protein: 7.2 g/dL (ref 6.0–8.3)

## 2024-06-13 LAB — LIPID PANEL
Cholesterol: 154 mg/dL (ref 28–200)
HDL: 76.5 mg/dL
LDL Cholesterol: 68 mg/dL (ref 10–99)
NonHDL: 77.19
Total CHOL/HDL Ratio: 2
Triglycerides: 47 mg/dL (ref 10.0–149.0)
VLDL: 9.4 mg/dL (ref 0.0–40.0)

## 2024-06-13 LAB — POC URINALSYSI DIPSTICK (AUTOMATED)
Bilirubin, UA: NEGATIVE
Blood, UA: NEGATIVE
Glucose, UA: NEGATIVE
Ketones, UA: NEGATIVE
Leukocytes, UA: NEGATIVE
Nitrite, UA: NEGATIVE
Protein, UA: NEGATIVE
Spec Grav, UA: 1.015
Urobilinogen, UA: 0.2 U/dL
pH, UA: 7

## 2024-06-13 LAB — TSH: TSH: 0.21 u[IU]/mL — ABNORMAL LOW (ref 0.35–5.50)

## 2024-06-13 LAB — VITAMIN D 25 HYDROXY (VIT D DEFICIENCY, FRACTURES): VITD: 34.83 ng/mL (ref 30.00–100.00)

## 2024-06-13 NOTE — Patient Instructions (Signed)
" °  I have sent in your order electronically for the following: ultrasound pelvis  at this location below. Please call to schedule the appointment at your convenience  [facility] outpatient imaging center off [address], [place] [address] Phone [phone]  8-5 pm   "

## 2024-06-13 NOTE — Progress Notes (Signed)
 "  Subjective:  Patient ID: Megan Stokes, female    DOB: [birth year]  Age: 73 y.o. MRN: [id number]  Patient Care Team: Corwin Antu, FNP as PCP - General (Family Medicine) Loring Tanda Mae, MD (Family Medicine)   CC:  Chief Complaint  Patient presents with   Annual Exam    Had some dysuria starting last week. Some abdominal pain.     HPI Megan Stokes is a 73 y.o. female who presents today for an annual physical exam. She reports consuming a general diet. Walks several times a week She generally feels well. She reports sleeping well. She does have additional problems to discuss today.   Vision:Not within last year Dental:Receives regular dental care  Mammogram: 07/24/23 Colonoscopy: 2020  Bone density scan: 07/11/22  Pt is with acute concerns.   Discussed the use of AI scribe software for clinical note transcription with the patient, who gave verbal consent to proceed.  History of Present Illness Megan Stokes is a 73 year old female who presents with lower abdominal and groin pain.  She experiences burning and pain during urination, which has slightly improved. The pain occurs daily in the lower abdomen and groin area, resembling menstrual cramps and bloating. The pain has been present for months and remains consistent in intensity. No changes in bowel movements, nausea, or vomiting. She reports feeling more gassy than usual and a little bloated. Appetite is okay.    Advanced Directives Patient does not have advanced directives . She does not have a copy in the electronic medical record.   DEPRESSION SCREENING    06/13/2024   10:20 AM 02/12/2024   10:27 AM 05/28/2023    2:13 PM 04/25/2023   10:21 AM 12/08/2022   10:13 AM 10/16/2022    9:32 AM 09/07/2022    9:57 AM  PHQ 2/9 Scores  PHQ - 2 Score 0 0 0 0 0 0 2  PHQ- 9 Score  1   0  3   4      Data saved with a previous flowsheet row definition     ROS: Negative unless specifically indicated above in HPI.    Current Medications[1]    Objective:    BP 138/88 (BP Location: Left Arm, Patient Position: Sitting, Cuff Size: Normal)   Pulse (!) 58   Temp 98 F (36.7 C) (Oral)   Ht 5' 1 (1.549 m)   Wt 102 lb (46.3 kg)   SpO2 99%   BMI 19.27 kg/m   BP Readings from Last 3 Encounters:  06/13/24 138/88  02/12/24 114/80  06/05/23 (!) 168/77      Physical Exam Vitals reviewed.  Constitutional:      General: She is not in acute distress.    Appearance: Normal appearance. She is normal weight. She is not ill-appearing.  HENT:     Head: Normocephalic.     Right Ear: Tympanic membrane normal.     Left Ear: Tympanic membrane normal.     Nose: Nose normal.     Mouth/Throat:     Mouth: Mucous membranes are moist.  Eyes:     Extraocular Movements: Extraocular movements intact.     Pupils: Pupils are equal, round, and reactive to light.  Cardiovascular:     Rate and Rhythm: Normal rate and regular rhythm.  Pulmonary:     Effort: Pulmonary effort is normal.     Breath sounds: Normal breath sounds.  Abdominal:     General: Abdomen is flat. Bowel  sounds are normal.     Palpations: Abdomen is soft.     Tenderness: There is abdominal tenderness in the right lower quadrant and suprapubic area. There is no guarding or rebound. Negative signs include Murphy's sign, Rovsing's sign and psoas sign.  Musculoskeletal:        General: Normal range of motion.     Cervical back: Normal range of motion.  Skin:    General: Skin is warm.     Capillary Refill: Capillary refill takes less than 2 seconds.  Neurological:     General: No focal deficit present.     Mental Status: She is alert.  Psychiatric:        Mood and Affect: Mood normal.        Behavior: Behavior normal.        Thought Content: Thought content normal.        Judgment: Judgment normal.       Results Labs UA (06/13/2024): Within normal limits  Diagnostic Colonoscopy (2020): Polyps noted Bone density (2024):  Osteoporosis      Assessment & Plan:   Assessment and Plan Assessment & Plan Chronic right lower quadrant and pelvic pain Chronic pain in the right lower quadrant and pelvic area for several months, described as cramping and bloating, similar to menstrual symptoms. No associated changes in bowel movements, nausea, or vomiting. Tenderness on examination. Differential includes musculoskeletal strain from lifting, given recent lifting of a grandson. - Ordered pelvic ultrasound to evaluate the cause of pain. - Ordered blood work to check white blood cell count and kidney function. - Advised to go to the ER if fever, vomiting, worsening pain, or blood in stool occur. - Will consider CT scan if ultrasound is inconclusive and pain persists.  Dysuria Intermittent dysuria with improvement over time. Urinalysis shows clear urine with no signs of infection, ruling out urinary tract infection. - Continue to monitor symptoms and reassess if dysuria worsens or persists.  Age-related osteoporosis Osteoporosis managed with Alendronate . Bone density last checked in 2024. - Ordered bone density scan to be done with the next mammogram.  General health maintenance Routine health maintenance discussed. Mammogram completed in February. Colonoscopy history reviewed; last colonoscopy in 2020 with polyps. Discussed Cologuard as an alternative screening method. Eye and dental exams are overdue. Flu vaccine discussed and agreed upon. - Ordered Cologuard for colorectal cancer screening. - Advised to schedule eye and dental exams. -Patient Counseling(The following topics were reviewed):  Preventative care handout given to pt  Health maintenance and immunizations reviewed. Please refer to Health maintenance section. Pt advised on safe sex, wearing seatbelts in car, and proper nutrition labwork ordered today for annual Dental health: Discussed importance of regular tooth brushing, flossing, and dental  visits.         Follow-up: Return in about 2 weeks (around 06/27/2024) for f/u abd pain .   Ginger Patrick, FNP     [1]  Current Outpatient Medications:    alendronate  (FOSAMAX ) 70 MG tablet, Take 1 tablet by mouth once a week, Disp: 12 tablet, Rfl: 0   cholecalciferol (VITAMIN D3) 25 MCG (1000 UNIT) tablet, Take 1,000 Units by mouth daily., Disp: , Rfl:    cyanocobalamin  (VITAMIN B12) 1000 MCG tablet, Take 1,000 mcg by mouth daily., Disp: , Rfl:    levothyroxine  (SYNTHROID ) 75 MCG tablet, TAKE 1 TABLET BY MOUTH ONCE DAILY EXCEPT  SATURDAY, Disp: 90 tablet, Rfl: 0   lisinopril  (ZESTRIL ) 30 MG tablet, Take 1 tablet by mouth once daily  for blood pressure, Disp: 90 tablet, Rfl: 1   lovastatin  (MEVACOR ) 40 MG tablet, TAKE 1 TABLET EVERY DAY, Disp: 90 tablet, Rfl: 3   PROCTO-MED HC  2.5 % rectal cream, APPLY  CREAM RECTALLY TWICE DAILY, Disp: 28 g, Rfl: 0   valACYclovir  (VALTREX ) 1000 MG tablet, Take one tablet bid for one dose prn breakout, Disp: 30 tablet, Rfl: 0  "

## 2024-06-15 ENCOUNTER — Ambulatory Visit: Payer: Self-pay | Admitting: Family

## 2024-06-15 DIAGNOSIS — D649 Anemia, unspecified: Secondary | ICD-10-CM

## 2024-06-15 DIAGNOSIS — E039 Hypothyroidism, unspecified: Secondary | ICD-10-CM

## 2024-06-16 MED ORDER — LEVOTHYROXINE SODIUM 50 MCG PO TABS: 50.0000 ug | ORAL_TABLET | Freq: Every day | ORAL | 3 refills | Status: AC

## 2024-06-18 ENCOUNTER — Inpatient Hospital Stay
Admission: RE | Admit: 2024-06-18 | Discharge: 2024-06-18 | Disposition: A | Source: Ambulatory Visit | Attending: Orthopedic Surgery | Admitting: Orthopedic Surgery

## 2024-06-18 ENCOUNTER — Ambulatory Visit
Admission: RE | Admit: 2024-06-18 | Discharge: 2024-06-18 | Disposition: A | Discharge status: Home / Self Care | Source: Ambulatory Visit | Attending: Orthopedic Surgery | Admitting: Orthopedic Surgery

## 2024-06-18 DIAGNOSIS — M75101 Unspecified rotator cuff tear or rupture of right shoulder, not specified as traumatic: Secondary | ICD-10-CM

## 2024-06-18 MED ORDER — GADOBENATE DIMEGLUMINE 529 MG/ML IV SOLN
0.1000 mL | Freq: Once | INTRAVENOUS | Status: AC | PRN
  Administered 2024-06-18: 0.1 mL via INTRA_ARTICULAR

## 2024-06-18 MED ORDER — IOPAMIDOL (ISOVUE-M 200) INJECTION 41%
10.0000 mL | Freq: Once | INTRAMUSCULAR | Status: AC
  Administered 2024-06-18: 10 mL via INTRA_ARTICULAR

## 2024-06-20 ENCOUNTER — Other Ambulatory Visit: Payer: Self-pay | Admitting: Radiology

## 2024-06-20 ENCOUNTER — Telehealth: Payer: Self-pay

## 2024-06-20 DIAGNOSIS — D649 Anemia, unspecified: Secondary | ICD-10-CM

## 2024-06-20 LAB — FECAL OCCULT BLOOD, IMMUNOCHEMICAL: Fecal Occult Bld: NEGATIVE

## 2024-06-20 NOTE — Telephone Encounter (Signed)
 Copied from CRM #[id number]. Topic: General - Other >> Jun 20, 2024  8:32 AM Joesph NOVAK wrote: Reason for CRM: Needs to speak to someone in the lab, she believes she did her stool testing wrong.

## 2024-06-22 ENCOUNTER — Ambulatory Visit: Payer: Self-pay | Admitting: Family

## 2024-06-26 ENCOUNTER — Other Ambulatory Visit: Payer: Self-pay | Admitting: Family

## 2024-06-27 ENCOUNTER — Ambulatory Visit: Admitting: Family

## 2024-06-27 ENCOUNTER — Ambulatory Visit
Admission: RE | Admit: 2024-06-27 | Discharge: 2024-06-27 | Disposition: A | Source: Ambulatory Visit | Attending: Family

## 2024-06-27 ENCOUNTER — Encounter: Payer: Self-pay | Admitting: Family

## 2024-06-27 VITALS — BP 140/82 | Temp 98.7°F | Ht 61.0 in | Wt 99.8 lb

## 2024-06-27 DIAGNOSIS — R300 Dysuria: Secondary | ICD-10-CM | POA: Diagnosis not present

## 2024-06-27 DIAGNOSIS — R1085 Abdominal pain of multiple sites: Secondary | ICD-10-CM | POA: Diagnosis present

## 2024-06-27 DIAGNOSIS — D649 Anemia, unspecified: Secondary | ICD-10-CM

## 2024-06-27 DIAGNOSIS — Z8719 Personal history of other diseases of the digestive system: Secondary | ICD-10-CM

## 2024-06-27 DIAGNOSIS — R1031 Right lower quadrant pain: Secondary | ICD-10-CM | POA: Diagnosis present

## 2024-06-27 LAB — POCT URINE DIPSTICK
Bilirubin, UA: NEGATIVE
Blood, UA: NEGATIVE
Glucose, UA: NEGATIVE mg/dL
Ketones, POC UA: NEGATIVE mg/dL
Leukocytes, UA: NEGATIVE
Nitrite, UA: NEGATIVE
POC PROTEIN,UA: NEGATIVE
Spec Grav, UA: 1.01
Urobilinogen, UA: 0.2 U/dL
pH, UA: 6.5

## 2024-06-27 LAB — IBC + FERRITIN
Ferritin: 55.2 ng/mL (ref 10.0–291.0)
Iron: 119 ug/dL (ref 42–145)
Saturation Ratios: 32.1 % (ref 20.0–50.0)
TIBC: 371 ug/dL (ref 250.0–450.0)
Transferrin: 265 mg/dL (ref 212.0–360.0)

## 2024-06-27 LAB — CBC
HCT: 33.4 % — ABNORMAL LOW (ref 36.0–46.0)
Hemoglobin: 11.4 g/dL — ABNORMAL LOW (ref 12.0–15.0)
MCHC: 34 g/dL (ref 30.0–36.0)
MCV: 101.2 fl — ABNORMAL HIGH (ref 78.0–100.0)
Platelets: 294 K/uL (ref 150.0–400.0)
RBC: 3.3 Mil/uL — ABNORMAL LOW (ref 3.87–5.11)
RDW: 12.2 % (ref 11.5–15.5)
WBC: 3.4 K/uL — ABNORMAL LOW (ref 4.0–10.5)

## 2024-06-27 MED ORDER — IOHEXOL 300 MG/ML  SOLN
80.0000 mL | Freq: Once | INTRAMUSCULAR | Status: AC | PRN
  Administered 2024-06-27: 80 mL via INTRAVENOUS

## 2024-06-27 NOTE — Progress Notes (Signed)
 "  Established Patient Office Visit  Subjective:      CC:  Chief Complaint  Patient presents with   Follow-up    2 week follow up on abdominal pain    HPI: Megan Stokes is a 74 y.o. female presenting on 06/27/2024 for Follow-up (2 week follow up on abdominal pain) .  Discussed the use of AI scribe software for clinical note transcription with the patient, who gave verbal consent to proceed.  History of Present Illness Megan Stokes is a 74 year old female who presents with sharp, shooting pains in the lower abdomen and groin area.  She experiences sharp, shooting pains in the lower abdomen and groin area, which are intermittent and sometimes resemble menstrual cramps and bloating. The pain is more pronounced on one side but can occur bilaterally. There is no associated nausea, vomiting, or changes in bowel movements, although she notes increased gassiness and bloating.  She has a history of hernias in the groin area, previously diagnosed via a scan at the ER, and underwent surgery on both sides in 2020. She denies feeling any bulges in the groin area that improve with pressure.  Burning pain during urination, present during a previous visit on December 26, has resolved. However, she occasionally experiences pain when urinating intermittently, but denies any abnormal vaginal discharge or odors.  Her past medical history includes anemia, with recent blood work showing a decrease in blood levels compared to four months ago. She has a history of hyponatremia and slightly low thyroid  function, which are being monitored. She denies increased fatigue or dyspnea.  She mentions having a grandson whom she lifts, which may contribute to her symptoms, especially given her existing arm problem that necessitates lifting with one side.         Social history:  Relevant past medical, surgical, family and social history reviewed and updated as indicated. Interim medical history since  our last visit reviewed.  Allergies and medications reviewed and updated.  DATA REVIEWED: CHART IN EPIC     ROS: Negative unless specifically indicated above in HPI.   Current Medications[1]        Objective:        BP (!) 140/82 (BP Location: Right Arm, Patient Position: Sitting, Cuff Size: Normal)   Temp 98.7 F (37.1 C) (Temporal)   Ht 5' 1 (1.549 m)   Wt 99 lb 12.8 oz (45.3 kg)   BMI 18.86 kg/m   Physical Exam ABDOMEN: Tenderness in the abdomen. GENITOURINARY: Tenderness in the groin area.  Wt Readings from Last 3 Encounters:  06/27/24 99 lb 12.8 oz (45.3 kg)  06/13/24 102 lb (46.3 kg)  02/12/24 101 lb 3.2 oz (45.9 kg)    Physical Exam Vitals reviewed.  Constitutional:      General: She is not in acute distress.    Appearance: Normal appearance. She is normal weight. She is not ill-appearing, toxic-appearing or diaphoretic.  HENT:     Head: Normocephalic.  Cardiovascular:     Rate and Rhythm: Normal rate and regular rhythm.  Pulmonary:     Effort: Pulmonary effort is normal.  Abdominal:     General: Abdomen is flat. Bowel sounds are normal. There is no distension.     Palpations: Abdomen is soft.     Tenderness: There is abdominal tenderness in the right lower quadrant and left lower quadrant.     Hernia: A hernia is present. Hernia is present in the left inguinal area and right inguinal area (  moderate to severe pain on palpation palpable bulge present). Ventral: mild. Musculoskeletal:        General: Normal range of motion.  Neurological:     General: No focal deficit present.     Mental Status: She is alert and oriented to person, place, and time. Mental status is at baseline.  Psychiatric:        Mood and Affect: Mood normal.        Behavior: Behavior normal.        Thought Content: Thought content normal.        Judgment: Judgment normal.          Results Labs TSH (06/13/2024): Low-normal Vitamin D  (06/13/2024): Within normal  limits Sodium (06/13/2024): Low  Radiology Pelvic ultrasound (06/13/2024): Uterine atrophy consistent with age; ovaries not visualized due to excessive bowel gas  Assessment & Plan:   Assessment and Plan Assessment & Plan Chronic right lower quadrant and pelvic pain Intermittent sharp shooting pains in the lower abdomen and groin area, more pronounced on the right side. Previous hernia surgery in 2020. Tenderness in the groin area suggests possible hernia recurrence or other groin-related issues. No bowel changes or blood in stool reported. - Ordered CT scan to evaluate for hernia recurrence or other groin-related issues. Ddx right inguinal hernia, slight chance of left inguinal as well, diverticulitis, appendicitis both unlikely   Chronic anemia Previous blood level dips. No significant fatigue or shortness of breath reported. Recent labs showed changes from four months ago. - Will repeat blood levels to monitor for further drops.  Chronic hyponatremia Chronic low sodium levels, consistent with previous findings.  General health maintenance Discussion about colorectal cancer screening options. Last colonoscopy in 2020 with a small polyp found. Screening typically ends at age 19. Cologuard is more effective than Shield for detecting colorectal cancer and precancerous polyps. - Offered Cologuard for colorectal cancer screening if reassurance is desired.        Return if symptoms worsen or fail to improve.     Ginger Patrick, MSN, APRN, FNP-C [facility]        [1]  Current Outpatient Medications:    alendronate  (FOSAMAX ) 70 MG tablet, Take 1 tablet by mouth once a week, Disp: 12 tablet, Rfl: 0   cholecalciferol (VITAMIN D3) 25 MCG (1000 UNIT) tablet, Take 1,000 Units by mouth daily., Disp: , Rfl:    cyanocobalamin  (VITAMIN B12) 1000 MCG tablet, Take 1,000 mcg by mouth daily., Disp: , Rfl:    levothyroxine  (SYNTHROID ) 50 MCG tablet, Take 1 tablet  (50 mcg total) by mouth daily., Disp: 90 tablet, Rfl: 3   lisinopril  (ZESTRIL ) 30 MG tablet, Take 1 tablet by mouth once daily for blood pressure, Disp: 90 tablet, Rfl: 1   lovastatin  (MEVACOR ) 40 MG tablet, TAKE 1 TABLET EVERY DAY, Disp: 90 tablet, Rfl: 3   PROCTO-MED HC  2.5 % rectal cream, APPLY  CREAM RECTALLY TWICE DAILY, Disp: 28 g, Rfl: 0   valACYclovir  (VALTREX ) 1000 MG tablet, Take one tablet bid for one dose prn breakout, Disp: 30 tablet, Rfl: 0  "

## 2024-06-27 NOTE — Addendum Note (Signed)
 Addended by: HOPE VEVA PARAS on: 06/27/2024 08:37 AM   Modules accepted: Orders

## 2024-06-27 NOTE — Addendum Note (Signed)
 Addended by: ALBINO SHAVER C on: 06/27/2024 08:22 AM   Modules accepted: Orders

## 2024-06-27 NOTE — Addendum Note (Signed)
 Addended by: HOPE VEVA PARAS on: 06/27/2024 08:50 AM   Modules accepted: Orders

## 2024-06-30 ENCOUNTER — Ambulatory Visit: Payer: Self-pay | Admitting: Family

## 2024-07-17 ENCOUNTER — Ambulatory Visit (INDEPENDENT_AMBULATORY_CARE_PROVIDER_SITE_OTHER)

## 2024-07-17 VITALS — Ht 61.0 in | Wt 99.0 lb

## 2024-07-17 DIAGNOSIS — Z0000 Encounter for general adult medical examination without abnormal findings: Secondary | ICD-10-CM | POA: Diagnosis not present

## 2024-07-17 NOTE — Progress Notes (Signed)
 "  Chief Complaint  Patient presents with   Medicare Wellness     Subjective:   Megan Stokes is a 74 y.o. female who presents for a Medicare Annual Wellness Visit.  Visit info / Clinical Intake: Medicare Wellness Visit Type:: Subsequent Annual Wellness Visit Persons participating in visit and providing information:: patient Medicare Wellness Visit Mode:: Telephone If telephone:: video declined Since this visit was completed virtually, some vitals may be partially provided or unavailable. Missing vitals are due to the limitations of the virtual format.: Unable to obtain vitals - no equipment If Telephone or Video please confirm:: I connected with patient using audio/video enable telemedicine. I verified patient identity with two identifiers, discussed telehealth limitations, and patient agreed to proceed. Patient Location:: At the store Provider Location:: Office Interpreter Needed?: No Pre-visit prep was completed: yes AWV questionnaire completed by patient prior to visit?: no Living arrangements:: (!) lives alone Patient's Overall Health Status Rating: very good Typical amount of pain: none Does pain affect daily life?: no Are you currently prescribed opioids?: no  Dietary Habits and Nutritional Risks How many meals a day?: 3 Eats fruit and vegetables daily?: yes Most meals are obtained by: preparing own meals In the last 2 weeks, have you had any of the following?: none Diabetic:: no  Functional Status Activities of Daily Living (to include ambulation/medication): Independent Ambulation: Independent Medication Administration: Independent Home Management (perform basic housework or laundry): Independent Manage your own finances?: yes Primary transportation is: driving Concerns about vision?: no *vision screening is required for WTM* Concerns about hearing?: no  Fall Screening Falls in the past year?: 0 Number of falls in past year: 0 Was there an injury with  Fall?: 0 Fall Risk Category Calculator: 0 Patient Fall Risk Level: Low Fall Risk  Fall Risk Patient at Risk for Falls Due to: No Fall Risks Fall risk Follow up: Falls evaluation completed; Falls prevention discussed  Home and Transportation Safety: All rugs have non-skid backing?: N/A, no rugs All stairs or steps have railings?: N/A, no stairs Grab bars in the bathtub or shower?: yes Have non-skid surface in bathtub or shower?: yes Good home lighting?: yes Regular seat belt use?: yes Hospital stays in the last year:: no  Cognitive Assessment Difficulty concentrating, remembering, or making decisions? : no Will 6CIT or Mini Cog be Completed: no 6CIT or Mini Cog Declined: patient alert, oriented, able to answer questions appropriately and recall recent events  Advance Directives (For Healthcare) Does Patient Have a Medical Advance Directive?: Yes Does patient want to make changes to medical advance directive?: No - Patient declined Type of Advance Directive: Healthcare Power of [place]; Living will; Out of facility DNR (pink MOST or yellow form) Copy of Healthcare Power of Attorney in Chart?: No - copy requested Copy of Living Will in Chart?: No - copy requested Out of facility DNR (pink MOST or yellow form) in Chart? (Ambulatory ONLY): No - copy requested  Reviewed/Updated  Reviewed/Updated: Reviewed All (Medical, Surgical, Family, Medications, Allergies, Care Teams, Patient Goals)    Allergies (verified) Augmentin  [amoxicillin -pot clavulanate] and Codeine   Current Medications (verified) Outpatient Encounter Medications as of 07/17/2024  Medication Sig   alendronate  (FOSAMAX ) 70 MG tablet Take 1 tablet by mouth once a week   cholecalciferol (VITAMIN D3) 25 MCG (1000 UNIT) tablet Take 1,000 Units by mouth daily.   cyanocobalamin  (VITAMIN B12) 1000 MCG tablet Take 1,000 mcg by mouth daily.   levothyroxine  (SYNTHROID ) 50 MCG tablet Take 1 tablet (50 mcg total)  by mouth daily.    lisinopril  (ZESTRIL ) 30 MG tablet Take 1 tablet by mouth once daily for blood pressure   lovastatin  (MEVACOR ) 40 MG tablet TAKE 1 TABLET EVERY DAY   PROCTO-MED HC  2.5 % rectal cream APPLY  CREAM RECTALLY TWICE DAILY   valACYclovir  (VALTREX ) 1000 MG tablet Take one tablet bid for one dose prn breakout (Patient taking differently: Take 500 mg by mouth as needed. Take one tablet bid for one dose prn breakout)   No facility-administered encounter medications on file as of 07/17/2024.    History: Past Medical History:  Diagnosis Date   Hypertension    Hypothyroidism    PONV (postoperative nausea and vomiting)    Past Surgical History:  Procedure Laterality Date   BREAST BIOPSY Left    CESAREAN SECTION     X 2   HEMORROIDECTOMY     INGUINAL HERNIA REPAIR Bilateral 02/23/2022   Procedure: LAPAROSCOPIC INGUINAL HERNIA;  Surgeon: Vanderbilt Ned, MD;  Location: [facility] OR;  Service: General;  Laterality: Bilateral;   SHOULDER SURGERY Left    Pt had limited movement with left shoulder/arm   THYROID  CYST EXCISION     Family History  Problem Relation Age of Onset   Stroke Mother    Dementia Father    Social History   Occupational History   Occupation: RETIRED  Tobacco Use   Smoking status: Former    Types: Cigarettes   Smokeless tobacco: Never   Tobacco comments:    H/o 5 year smoking history   Vaping Use   Vaping status: Never Used  Substance and Sexual Activity   Alcohol  use: Yes    Alcohol /week: 2.0 - 3.0 standard drinks of alcohol     Types: 2 - 3 Glasses of wine per week    Comment: 2 glasses nightly   Drug use: No   Sexual activity: Not Currently   Tobacco Counseling Counseling given: Not Answered Tobacco comments: H/o 5 year smoking history   SDOH Screenings   Food Insecurity: No Food Insecurity (07/17/2024)  Housing: Unknown (07/17/2024)  Transportation Needs: No Transportation Needs (07/17/2024)  Utilities: Not At Risk (07/17/2024)  Alcohol  Screen: Low Risk  (05/28/2023)  Depression (PHQ2-9): Low Risk (07/17/2024)  Financial Resource Strain: Low Risk (05/28/2023)  Physical Activity: Sufficiently Active (07/17/2024)  Social Connections: Moderately Integrated (07/17/2024)  Stress: No Stress Concern Present (07/17/2024)  Tobacco Use: Medium Risk (07/17/2024)  Health Literacy: Adequate Health Literacy (07/17/2024)   See flowsheets for full screening details  Depression Screen PHQ 2 & 9 Depression Scale- Over the past 2 weeks, how often have you been bothered by any of the following problems? Little interest or pleasure in doing things: 0 Feeling down, depressed, or hopeless (PHQ Adolescent also includes...irritable): 0 PHQ-2 Total Score: 0 Trouble falling or staying asleep, or sleeping too much: 0 Feeling tired or having little energy: 0 Poor appetite or overeating (PHQ Adolescent also includes...weight loss): 0 Feeling bad about yourself - or that you are a failure or have let yourself or your family down: 0 Trouble concentrating on things, such as reading the newspaper or watching television (PHQ Adolescent also includes...like school work): 0 Moving or speaking so slowly that other people could have noticed. Or the opposite - being so fidgety or restless that you have been moving around a lot more than usual: 0 Thoughts that you would be better off dead, or of hurting yourself in some way: 0 PHQ-9 Total Score: 0 If you checked off any problems, how difficult  have these problems made it for you to do your work, take care of things at home, or get along with other people?: Not difficult at all  Depression Treatment Depression Interventions/Treatment : EYV7-0 Score <4 Follow-up Not Indicated     Goals Addressed             This Visit's Progress    Patient Stated   On track    Stay health             Objective:    Today's Vitals   07/17/24 1047  Weight: 99 lb (44.9 kg)  Height: 5' 1 (1.549 m)   Body mass index is 18.71  kg/m.  Hearing/Vision screen Hearing Screening - Comments:: Denies hearing difficulties   Vision Screening - Comments:: Denies vision issues./not UTD/No provider  Immunizations and Health Maintenance Health Maintenance  Topic Date Due   COVID-19 Vaccine (5 - 2025-26 season) 02/16/2025 (Originally 02/18/2024)   Hepatitis C Screening  06/13/2025 (Originally 12/23/1968)   Medicare Annual Wellness (AWV)  07/17/2025   Pneumococcal Vaccine: 50+ Years  Completed   Influenza Vaccine  Completed   Bone Density Scan  Completed   Zoster Vaccines- Shingrix  Completed   Meningococcal B Vaccine  Aged Out   DTaP/Tdap/Td  Discontinued   Mammogram  Discontinued   Colonoscopy  Discontinued        Assessment/Plan:  This is a routine wellness examination for Miela.  Patient Care Team: Corwin Antu, FNP as PCP - General (Family Medicine) Loring Tanda Mae, MD (Family Medicine)  I have personally reviewed and noted the following in the patients chart:   Medical and social history Use of alcohol , tobacco or illicit drugs  Current medications and supplements including opioid prescriptions. Functional ability and status Nutritional status Physical activity Advanced directives List of other physicians Hospitalizations, surgeries, and ER visits in previous 12 months Vitals Screenings to include cognitive, depression, and falls Referrals and appointments  No orders of the defined types were placed in this encounter.  In addition, I have reviewed and discussed with patient certain preventive protocols, quality metrics, and best practice recommendations. A written personalized care plan for preventive services as well as general preventive health recommendations were provided to patient.   Takari Lundahl L Flornce Record, CMA   07/17/2024   Return in 1 year (on 07/17/2025).  After Visit Summary: (MyChart) Due to this being a telephonic visit, the after visit summary with patients personalized plan was  offered to patient via MyChart   Nurse Notes: Patient is due for a Hep C screening and she would like to discuss this with provider during her next office visit. "

## 2024-07-17 NOTE — Patient Instructions (Signed)
 Ms. Handler,  Thank you for taking the time for your Medicare Wellness Visit. I appreciate your continued commitment to your health goals. Please review the care plan we discussed, and feel free to reach out if I can assist you further.  Please note that Annual Wellness Visits do not include a physical exam. Some assessments may be limited, especially if the visit was conducted virtually. If needed, we may recommend an in-person follow-up with your provider.  Ongoing Care Seeing your primary care provider every 3 to 6 months helps us  monitor your health and provide consistent, personalized care. Last office visiti on 06/27/2024.  You are due for a Hep C screening.  Remember to discuss this with your provider during your next office visit.  Keep up the good work.  Referrals If a referral was made during today's visit and you haven't received any updates within two weeks, please contact the referred provider directly to check on the status.  Recommended Screenings:  Health Maintenance  Topic Date Due   Medicare Annual Wellness Visit  05/27/2024   COVID-19 Vaccine (5 - 2025-26 season) 02/16/2025*   Hepatitis C Screening  06/13/2025*   Pneumococcal Vaccine for age over 43  Completed   Flu Shot  Completed   Osteoporosis screening with Bone Density Scan  Completed   Zoster (Shingles) Vaccine  Completed   Meningitis B Vaccine  Aged Out   DTaP/Tdap/Td vaccine  Discontinued   Breast Cancer Screening  Discontinued   Colon Cancer Screening  Discontinued  *Topic was postponed. The date shown is not the original due date.       07/17/2024   10:50 AM  Advanced Directives  Does Patient Have a Medical Advance Directive? Yes  Type of Estate Agent of [place];Living will;Out of facility DNR (pink MOST or yellow form)  Copy of Healthcare Power of Attorney in Chart? No - copy requested    Vision: Annual vision screenings are recommended for early detection of glaucoma,  cataracts, and diabetic retinopathy. These exams can also reveal signs of chronic conditions such as diabetes and high blood pressure.  Dental: Annual dental screenings help detect early signs of oral cancer, gum disease, and other conditions linked to overall health, including heart disease and diabetes.  Please see the attached documents for additional preventive care recommendations.

## 2024-07-19 ENCOUNTER — Other Ambulatory Visit (HOSPITAL_BASED_OUTPATIENT_CLINIC_OR_DEPARTMENT_OTHER): Payer: Self-pay

## 2024-07-19 ENCOUNTER — Emergency Department (HOSPITAL_BASED_OUTPATIENT_CLINIC_OR_DEPARTMENT_OTHER)

## 2024-07-19 ENCOUNTER — Other Ambulatory Visit: Payer: Self-pay

## 2024-07-19 ENCOUNTER — Emergency Department (HOSPITAL_BASED_OUTPATIENT_CLINIC_OR_DEPARTMENT_OTHER): Admitting: Radiology

## 2024-07-19 ENCOUNTER — Emergency Department (HOSPITAL_BASED_OUTPATIENT_CLINIC_OR_DEPARTMENT_OTHER)
Admission: EM | Admit: 2024-07-19 | Discharge: 2024-07-19 | Disposition: A | Discharge status: Home / Self Care | Attending: Emergency Medicine | Admitting: Emergency Medicine

## 2024-07-19 DIAGNOSIS — E039 Hypothyroidism, unspecified: Secondary | ICD-10-CM | POA: Diagnosis not present

## 2024-07-19 DIAGNOSIS — W000XXA Fall on same level due to ice and snow, initial encounter: Secondary | ICD-10-CM | POA: Insufficient documentation

## 2024-07-19 DIAGNOSIS — Z79899 Other long term (current) drug therapy: Secondary | ICD-10-CM | POA: Diagnosis not present

## 2024-07-19 DIAGNOSIS — S63502A Unspecified sprain of left wrist, initial encounter: Secondary | ICD-10-CM | POA: Insufficient documentation

## 2024-07-19 DIAGNOSIS — M542 Cervicalgia: Secondary | ICD-10-CM | POA: Diagnosis not present

## 2024-07-19 DIAGNOSIS — S299XXA Unspecified injury of thorax, initial encounter: Secondary | ICD-10-CM | POA: Diagnosis present

## 2024-07-19 DIAGNOSIS — W19XXXA Unspecified fall, initial encounter: Secondary | ICD-10-CM

## 2024-07-19 DIAGNOSIS — S2232XA Fracture of one rib, left side, initial encounter for closed fracture: Secondary | ICD-10-CM | POA: Insufficient documentation

## 2024-07-19 DIAGNOSIS — M25512 Pain in left shoulder: Secondary | ICD-10-CM | POA: Insufficient documentation

## 2024-07-19 DIAGNOSIS — R519 Headache, unspecified: Secondary | ICD-10-CM | POA: Insufficient documentation

## 2024-07-19 DIAGNOSIS — M25532 Pain in left wrist: Secondary | ICD-10-CM | POA: Diagnosis not present

## 2024-07-19 DIAGNOSIS — I10 Essential (primary) hypertension: Secondary | ICD-10-CM | POA: Diagnosis not present

## 2024-07-19 DIAGNOSIS — R030 Elevated blood-pressure reading, without diagnosis of hypertension: Secondary | ICD-10-CM

## 2024-07-19 MED ORDER — LIDOCAINE 5 % EX PTCH
1.0000 | MEDICATED_PATCH | CUTANEOUS | 0 refills | Status: AC
  Filled 2024-07-19: qty 30, 30d supply, fill #0

## 2024-07-19 MED ORDER — AMLODIPINE BESYLATE 2.5 MG PO TABS
2.5000 mg | ORAL_TABLET | Freq: Every day | ORAL | 0 refills | Status: AC
  Filled 2024-07-19: qty 30, 30d supply, fill #0

## 2024-07-19 NOTE — ED Triage Notes (Signed)
 Fell on ice, to the left and backwards. Did not hit head. Left neck,left ribs and left wrist hurting.  No blood thinners

## 2024-07-19 NOTE — Discharge Instructions (Addendum)
 Your head CT and your neck CT are normal. You do have 1 posterior lateral left sixth rib fracture. There is no obvious fracture of your left wrist however sometimes we can miss small fractures on early x-rays.  I would recommend wearing a left wrist brace and follow-up with primary care for repeat imaging if pain persists.   Please take 1000mg  of tylenol  (acetaminophen ) every 8 hours, as needed for pain. You can also take up to 800mg  of Ibuprofen  (NSAID) every 8 hours OR 500mg  of Naproxen (NSAID) every 12 hours with food, as needed for pain.  You can use Lidocaine  patch, ice or head over area of pain.   Call primary care for follow up appointment and return to ED with new or worsening symptoms.

## 2024-07-19 NOTE — ED Provider Notes (Signed)
 " [place] EMERGENCY DEPARTMENT AT [facility] Provider Note   CSN: [id number] Arrival date & time: 07/19/24  1246     Patient presents with: Megan   PAISLI Stokes is a 74 y.o. female patient with past medical history of back pain, hypertension, hypothyroidism dents emergency room after having a mechanical fall from ground height.  Patient reports that she slipped on ice and fell backwards.  She fell primarily on her left shoulder.  Complains of left-sided headache, left-sided neck pain, left-sided shoulder pain, left-sided rib pain and left wrist pain.  She is not on blood thinners.  She denies loss of consciousness, seizure or repetitive episodes of vomiting after fall.  Reports that her left wrist and left chest wall are the worst.     Fall       Prior to Admission medications  Medication Sig Start Date End Date Taking? Authorizing Provider  lidocaine  (LIDODERM ) 5 % Place 1 patch onto the skin daily. Remove & Discard patch within 12 hours or as directed by MD 07/19/24  Yes Carmelo Reidel N, PA-C  alendronate  (FOSAMAX ) 70 MG tablet Take 1 tablet by mouth once a week 06/26/24   Corwin Antu, FNP  cholecalciferol (VITAMIN D3) 25 MCG (1000 UNIT) tablet Take 1,000 Units by mouth daily.    [provider]  cyanocobalamin  (VITAMIN B12) 1000 MCG tablet Take 1,000 mcg by mouth daily.    [provider]  levothyroxine  (SYNTHROID ) 50 MCG tablet Take 1 tablet (50 mcg total) by mouth daily. 06/16/24   Dugal, Tabitha, FNP  lisinopril  (ZESTRIL ) 30 MG tablet Take 1 tablet by mouth once daily for blood pressure 05/26/24   Dugal, Tabitha, FNP  lovastatin  (MEVACOR ) 40 MG tablet TAKE 1 TABLET EVERY DAY 01/15/24   Corwin Antu, FNP  PROCTO-MED HC  2.5 % rectal cream APPLY  CREAM RECTALLY TWICE DAILY 04/14/24   Dugal, Tabitha, FNP  valACYclovir  (VALTREX ) 1000 MG tablet Take one tablet bid for one dose prn breakout Patient taking differently: Take 500 mg by mouth as needed.  Take one tablet bid for one dose prn breakout 10/26/23   Dugal, Tabitha, FNP    Allergies: Augmentin  [amoxicillin -pot clavulanate] and Codeine    Review of Systems  Musculoskeletal:  Positive for arthralgias.    Updated Vital Signs BP (!) 208/94   Pulse 78   Temp 98.1 F (36.7 C) (Oral)   Resp 16   SpO2 100%   Physical Exam Vitals and nursing note reviewed.  Constitutional:      General: She is not in acute distress.    Appearance: She is not toxic-appearing.  HENT:     Head: Normocephalic and atraumatic.     Comments: Head appears atraumatic. Eyes:     General: No scleral icterus.    Conjunctiva/sclera: Conjunctivae normal.  Neck:     Comments: No cervical, thoracic or lumbar midline tenderness step-off or deformity.  Left lateral neck tenderness. Cardiovascular:     Rate and Rhythm: Normal rate and regular rhythm.     Pulses: Normal pulses.     Heart sounds: Normal heart sounds.  Pulmonary:     Effort: Pulmonary effort is normal. No respiratory distress.     Breath sounds: Normal breath sounds.  Abdominal:     General: Abdomen is flat. Bowel sounds are normal.     Palpations: Abdomen is soft.     Tenderness: There is no abdominal tenderness.     Comments: No bruising to chest or abdomen.  Musculoskeletal:     Comments: Left wrist tenderness and swelling.  Neurovascularly intact distally. Mild tenderness to left lateral chest wall. Mild tenderness over left scapula.  Skin:    General: Skin is warm and dry.     Findings: No lesion.  Neurological:     General: No focal deficit present.     Mental Status: She is alert and oriented to person, place, and time. Mental status is at baseline.     Comments: No focal neurological deficit.     (all labs ordered are listed, but only abnormal results are displayed) Labs Reviewed - No data to display  EKG: None  Radiology: DG Wrist Complete Left Result Date: 07/19/2024 EXAM: 3 or more VIEW(S) XRAY OF THE LEFT WRIST  07/19/2024 01:21:36 PM COMPARISON: None available. CLINICAL HISTORY: Fall. FINDINGS: BONES AND JOINTS: No acute fracture. No malalignment. 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 SOFT TISSUES: Soft tissue edema. IMPRESSION: 1. No acute fracture or dislocation. 2. Soft tissue edema. 3. Chondrocalcinosis of the wrist. Electronically signed by: Morgane Naveau MD 07/19/2024 01:29 PM EST RP Workstation: [id number]   DG Ribs Unilateral W/Chest Left Result Date: 07/19/2024 EXAM: LEFT RIBS AND AP CHEST (2 VIEWS) 07/19/2024 01:21:36 PM COMPARISON: Chest x-ray 95x33. CLINICAL HISTORY: Fall. FINDINGS: BONES: Acute minimally displaced fractured posterolateral 6 left rib. Partially visualized left shoulder arthroplasty. LUNGS AND PLEURA: No consolidation or pulmonary edema. No pleural effusion or pneumothorax. HEART AND MEDIASTINUM: Atherosclerotic plaque. No acute abnormality of the cardiac and mediastinal silhouettes. IMPRESSION: 1. Acute minimally displaced fracture of the posterolateral left sixth rib. No associated radiographic evidence of pneumothorax. Electronically signed by: Morgane Naveau MD 07/19/2024 01:28 PM EST RP Workstation: [id number]   DG Scapula Left Result Date: 07/19/2024 EXAM: 1 VIEW(S) XRAY OF THE LEFT SCAPULA 07/19/2024 01:21:36 PM COMPARISON: None available. CLINICAL HISTORY: Fall. FINDINGS: BONES AND JOINTS: Left shoulder arthroplasty in place. SOFT TISSUES: Aortic calcification. IMPRESSION: 1. No acute findings. 2. Left shoulder arthroplasty in place. Electronically signed by: Morgane Naveau MD 07/19/2024 01:26 PM EST RP Workstation: [id number]   CT Cervical Spine Wo Contrast Result Date: 07/19/2024 EXAM: CT CERVICAL SPINE WITHOUT CONTRAST 07/19/2024 01:10:24 PM TECHNIQUE: CT of the cervical spine was performed without the administration of intravenous contrast. Multiplanar reformatted images are provided for review. Automated exposure control, iterative  reconstruction, and/or weight based adjustment of the mA/kV was utilized to reduce the radiation dose to as low as reasonably achievable. COMPARISON: None available. CLINICAL HISTORY: Polytrauma, blunt. FINDINGS: BONES AND ALIGNMENT: No acute fracture or traumatic malalignment. DEGENERATIVE CHANGES: Multilevel moderate degenerative change in the spine. No severe osseous neural foraminal or central canal stenosis. SOFT TISSUES: No prevertebral soft tissue edema. IMPRESSION: 1. No evidence of acute traumatic injury. Electronically signed by: Morgane Naveau MD 07/19/2024 01:24 PM EST RP Workstation: [id number]   CT Head Wo Contrast Result Date: 07/19/2024 EXAM: CT HEAD WITHOUT CONTRAST 07/19/2024 01:10:24 PM TECHNIQUE: CT of the head was performed without the administration of intravenous contrast. Automated exposure control, iterative reconstruction, and/or weight based adjustment of the mA/kV was utilized to reduce the radiation dose to as low as reasonably achievable. COMPARISON: CT head 06/05/2023. CLINICAL HISTORY: Head trauma, moderate-severe. FINDINGS: BRAIN AND VENTRICLES: No acute hemorrhage. No evidence of acute infarct. No hydrocephalus. No extra-axial collection. No mass effect or midline shift. Patchy and confluent areas of decreased attenuation are noted throughout the deep and periventricular white matter of the cerebral hemispheres bilaterally suggestive of chronic microvascular ischemic changes. ORBITS: No acute  abnormality. SINUSES: No acute abnormality. SOFT TISSUES AND SKULL: No acute soft tissue abnormality. No skull fracture. IMPRESSION: 1. No acute intracranial abnormality. Electronically signed by: Morgane Naveau MD 07/19/2024 01:22 PM EST RP Workstation: [id number]     Procedures   Medications Ordered in the ED - No data to display                                  Medical Decision Making Amount and/or Complexity of Data Reviewed Radiology: ordered.  Risk Prescription drug  management.   This patient presents to the ED for concern of fall, this involves an extensive number of treatment options, and is a complaint that carries with it a high risk of complications and morbidity.  The differential diagnosis includes intracranial hemorrhage, subdural/epidural hematoma, vertebral fracture, spinal cord injury, muscle strain, skull fracture, fracture, splenic injury, liver injury, perforated    Imaging Studies ordered:  I ordered imaging studies including CT head, cervical spine.  X-ray of left scapula, left rib view with chest and left wrist I independently visualized and interpreted imaging which showed left sixth posterior lateral rib fracture, nondisplaced with no pneumothorax.  No other acute findings. I agree with the radiologist interpretation   Cardiac Monitoring: / EKG:  The patient was maintained on a cardiac monitor.     Problem List / ED Course / Critical interventions / Medication management  Presents to emergency room with complaint of mechanical fall.  Patient reports she had mechanical fall from ground height slipping on ice.  She fell backwards hitting posterior aspect of her head and left shoulder.  She is primarily complaining of left rib and wrist pain.  Her head appears atraumatic on exam and she has no cervical midline tenderness.  I obtained CT imaging of head and cervical spine which showed no acute findings.   Her vitals are stable and she is well-appearing.  She does have elevated blood pressure but denies any chest pain, shortness of breath, vision changes.  Suspect this is, part due to pain.  Patient prefers to follow-up for further management with PCP for this and have recheck.   Also found to have left rib fracture her pain is well-controlled and she was provided with incentive spirometer. No pneumothorax, no hypoxia.  Has left wrist swelling will provide with left wrist brace but no acute fracture seen on x-ray.  Neurovascularly intact  distally. I have reviewed the patients home medicines and have made adjustments as needed. Provided with incentive spirometer. Also provided with a left wrist brace.  Patient does not want any pain medications. Hemodynamically stable and well-appearing feel appropriate for discharge with outpatient follow-up.  Given return precautions.         Final diagnoses:  Elevated blood pressure reading  Fall, initial encounter  Sprain of left wrist, unspecified location, initial encounter  Closed fracture of one rib of left side, initial encounter    ED Discharge Orders          Ordered    lidocaine  (LIDODERM ) 5 %  Every 24 hours        07/19/24 1339               Nykole Matos, Warren SAILOR, PA-C 07/19/24 1402    Towana Ozell BROCKS, MD 07/19/24 1620  "

## 2024-07-19 NOTE — Progress Notes (Signed)
 Educated the Pt on incentive spirometer

## 2024-07-22 ENCOUNTER — Ambulatory Visit: Payer: Self-pay

## 2024-07-22 NOTE — Telephone Encounter (Signed)
 I spoke with pt; pt said that her heart is not racing now because pt is not feeling anxious at this moment. Most recent BP this after noon was 145/76 Pt not sure of pulse rate.pt said forgetfulness is no worse than usual. Pt having lt sided CP at rib area where fell. Pt said she is not feeling shakey or sweatty at this time. Pt said it does hurt if she tries to get deep breath.,pt said she knows a fx rib is going to hurt. Pt is also having some neck pain all the way around the neck but pt wonders if could be from the fall. UC & ED precautions given and pt voiced understanding. Pt said there is no one there now that could take her to UC for eval. Pt does not want to go ED by EMS. Pt said if condition worsens she will call one of her sons to take her to [facility] ED. Pt will call [facility] on 07/23/24 with update on how she is feeling., pt said she does not feel that bad now to go anywhere. Pt does not sound in distress on phone and pt is speaking in full sentences. No SOB at this time.sending note to ONEIDA Patrick FNP who is out of office and Dr Avelina who is in office and [facility].

## 2024-07-23 NOTE — Telephone Encounter (Signed)
 NOTED

## 2024-07-23 NOTE — Telephone Encounter (Signed)
 Thank you Megan Stokes   [name] if we could just give her a call a little later today to check in?

## 2024-07-23 NOTE — Telephone Encounter (Signed)
 Spoke with pt and she states that she is a little better since yesterday. She has been taking ibuprofen  for the pain and feels like this has been helping.

## 2024-07-25 ENCOUNTER — Telehealth: Payer: Self-pay

## 2024-07-25 ENCOUNTER — Ambulatory Visit: Payer: Self-pay | Admitting: Family

## 2024-07-25 ENCOUNTER — Other Ambulatory Visit: Payer: Self-pay | Admitting: Family

## 2024-07-25 DIAGNOSIS — S2232XA Fracture of one rib, left side, initial encounter for closed fracture: Secondary | ICD-10-CM | POA: Insufficient documentation

## 2024-07-25 MED ORDER — OXYCODONE-ACETAMINOPHEN 5-325 MG PO TABS: 1.0000 | ORAL_TABLET | Freq: Four times a day (QID) | ORAL | 0 refills | Status: AC | PRN

## 2024-07-25 MED ORDER — TRAMADOL HCL 50 MG PO TABS: 50.0000 mg | ORAL_TABLET | Freq: Three times a day (TID) | ORAL | 0 refills | Status: AC | PRN

## 2024-07-25 NOTE — Telephone Encounter (Signed)
 Pt picked up med from pharmacy but did not look at until got home.Pt said she cannot take oxycodone  apap causes N & V.(Pt wants added to allergy list). Pt also cannot take codeine. Pt said the ibuprofen  , lidocaine  patch, heat and ice is not helping. Pt seen at [facility] ED on 07/19/24 with fx rib that was closed. Pt said she knows there is something stronger than Ibuprofen  she can take but cannot remember the name of. Per hx medlist pt was given tramadol  50 mg by Michael Maczis PA 02/24/22. Pt cannot remember if she could take the tramadol  or not. It is not on pts allergy list. T Dugal fNP and CHRISTELLA Crandall NP is out of office this afternoon and pt wants to make sure something is sent to pharmacy so she will not be in pain all weekend.pt already has HFU appt with T Dugal FNPon 07/28/24 and pt plans to keep that appt. Dr Cleatus said to send the note to him and he will workon when finishes with pts in office. Pt voiced understanding and appreciative and pt will ck with [facility] later this evening and then pick up med. Pt also wanted to let Dr Cleatus know that pt has been having hypertension issues that were also addressed at ED. Pt said was given amlodipine  2.5 mg to take one daily at ED but pt has not started that med yet. Pt said 07/24/24 BP 156/76 P 50-70. Pt has been taking lisinopril  30 mg daily and has not stopped that med. Pt wanted to make sure Dr Cleatus knew that when deciding what pain med pt could take. Dr Cleatus also advised me to  let pt know if she cannot take the oxycodone  she can return to pharmacy and they will destroy it. Pt voiced understanding. Sending note to Dr Cleatus and RICK to ONEIDA Patrick FNP.

## 2024-07-25 NOTE — Progress Notes (Unsigned)
 Has pt tolerated oxycodone  acetaminophen  I do see she had this in 2024 for pain.  I can send some of this in however if she notices any sob or chest/heart pain then she needs to go to the ER over the weekend.   Advise her if she does take this (I did send to pharmacy) that she may also want to pick up some sennakot as well as miralax  to avoid constipation from the meds.  ------------------------------------ For my notes:  Pdmp reviewed.   Last metabolic panel  Lab Results  Component Value Date   GLUCOSE 83 06/13/2024   NA 132 (L) 06/13/2024   K 3.9 06/13/2024   CL 97 06/13/2024   CO2 28 06/13/2024   BUN 12 06/13/2024   CREATININE 0.54 06/13/2024   GFR 90.03 06/13/2024   CALCIUM 9.0 06/13/2024   PROT 7.2 06/13/2024   ALBUMIN 4.5 06/13/2024   BILITOT 0.7 06/13/2024   ALKPHOS 48 06/13/2024   AST 20 06/13/2024   ALT 12 06/13/2024   ANIONGAP 9 02/24/2022

## 2024-07-25 NOTE — Telephone Encounter (Signed)
 FYI Only or Action Required?: Action required by provider: clinical question for provider and update on patient condition.  Patient was last seen in primary care on 06/27/2024 by Corwin Antu, FNP.  Called Nurse Triage reporting Chest Pain.  Symptoms began several days ago.  Interventions attempted: OTC medications: ibuprofen , Tylenol , Prescription medications: lidocaine  patches, and Ice/heat application.  Symptoms are: unchanged.  Triage Disposition: See PCP Within 2 Weeks (overriding See PCP When Office is Open (Within 3 Days))  Patient/caregiver understands and will follow disposition?: Yes                               Patient sustained a fall last Saturday and went to ED. Patient was evaluated for fractured ribs. Patient is calling back in today to report ibuprofen  q4h is no longer helping to relieve rib pain. Patient denied difficulty breathing and NV at this time. Patient is scheduled for HFU on 07/28/24 with PCP. Patient stated she has also tried Tylenol , lidocaine  patches and ice/heat with minimal relief. Patient would like to ask provider if she can prescribe something additional for pain relief. Please advise. Patient would like a call back.   Reason for Disposition  [1] After 3 days AND [2] chest pain not improved  Protocols used: Chest Injury-A-AH   Reason for Triage: Patient rib pain has worsened since she last spoke with NT this past Tuesday - ibuprofen  not working and would like another medication sent to pharmacy

## 2024-07-25 NOTE — Telephone Encounter (Signed)
 FYI to PCP.  Tramadol  rx sent.  Thanks.

## 2024-07-28 ENCOUNTER — Inpatient Hospital Stay: Admitting: Family

## 2024-07-29 ENCOUNTER — Ambulatory Visit: Admitting: Family
